# Patient Record
Sex: Female | Born: 1970
Health system: Southern US, Community
[De-identification: ages and names within clinical notes are randomized; demographics above are authoritative.]

## PROBLEM LIST (undated history)

## (undated) DIAGNOSIS — R112 Nausea with vomiting, unspecified: Secondary | ICD-10-CM

## (undated) DIAGNOSIS — Z9889 Other specified postprocedural states: Secondary | ICD-10-CM

## (undated) DIAGNOSIS — N83209 Unspecified ovarian cyst, unspecified side: Secondary | ICD-10-CM

## (undated) DIAGNOSIS — N390 Urinary tract infection, site not specified: Secondary | ICD-10-CM

## (undated) DIAGNOSIS — E079 Disorder of thyroid, unspecified: Secondary | ICD-10-CM

## (undated) DIAGNOSIS — Z9851 Tubal ligation status: Secondary | ICD-10-CM

## (undated) DIAGNOSIS — R1031 Right lower quadrant pain: Secondary | ICD-10-CM

## (undated) HISTORY — PX: COLONOSCOPY: SHX174

## (undated) HISTORY — DX: Urinary tract infection, site not specified: N39.0

## (undated) HISTORY — DX: Disorder of thyroid, unspecified: E07.9

## (undated) HISTORY — PX: TUBAL LIGATION: SHX77

---

## 1997-12-27 ENCOUNTER — Other Ambulatory Visit: Admission: RE | Admit: 1997-12-27 | Discharge: 1997-12-27 | Payer: Self-pay | Admitting: *Deleted

## 1998-02-04 HISTORY — PX: INCISION AND DRAINAGE BREAST ABSCESS: SUR672

## 1998-07-18 ENCOUNTER — Inpatient Hospital Stay (HOSPITAL_COMMUNITY): Admission: AD | Admit: 1998-07-18 | Discharge: 1998-07-21 | Payer: Self-pay | Admitting: Obstetrics and Gynecology

## 1998-07-22 ENCOUNTER — Encounter (HOSPITAL_COMMUNITY): Admission: RE | Admit: 1998-07-22 | Discharge: 1998-10-20 | Payer: Self-pay | Admitting: Obstetrics and Gynecology

## 1998-08-11 ENCOUNTER — Other Ambulatory Visit: Admission: RE | Admit: 1998-08-11 | Discharge: 1998-08-11 | Payer: Self-pay | Admitting: *Deleted

## 1998-08-31 ENCOUNTER — Other Ambulatory Visit: Admission: RE | Admit: 1998-08-31 | Discharge: 1998-08-31 | Payer: Self-pay | Admitting: Surgery

## 1998-10-13 ENCOUNTER — Ambulatory Visit (HOSPITAL_COMMUNITY): Admission: RE | Admit: 1998-10-13 | Discharge: 1998-10-14 | Payer: Self-pay | Admitting: Surgery

## 1998-10-23 ENCOUNTER — Encounter (HOSPITAL_COMMUNITY): Admission: RE | Admit: 1998-10-23 | Discharge: 1999-01-21 | Payer: Self-pay | Admitting: Obstetrics and Gynecology

## 1999-01-22 ENCOUNTER — Encounter (HOSPITAL_COMMUNITY): Admission: RE | Admit: 1999-01-22 | Discharge: 1999-04-10 | Payer: Self-pay | Admitting: Obstetrics and Gynecology

## 1999-07-30 ENCOUNTER — Ambulatory Visit (HOSPITAL_COMMUNITY): Admission: RE | Admit: 1999-07-30 | Discharge: 1999-07-30 | Payer: Self-pay | Admitting: Pediatrics

## 1999-07-30 ENCOUNTER — Encounter: Payer: Self-pay | Admitting: Pediatrics

## 2009-09-26 ENCOUNTER — Emergency Department (HOSPITAL_COMMUNITY): Admission: EM | Admit: 2009-09-26 | Discharge: 2009-09-26 | Payer: Self-pay | Admitting: Emergency Medicine

## 2009-11-21 ENCOUNTER — Ambulatory Visit: Payer: Self-pay | Admitting: Internal Medicine

## 2009-11-21 DIAGNOSIS — Z9189 Other specified personal risk factors, not elsewhere classified: Secondary | ICD-10-CM | POA: Insufficient documentation

## 2009-11-21 DIAGNOSIS — J309 Allergic rhinitis, unspecified: Secondary | ICD-10-CM | POA: Insufficient documentation

## 2009-11-23 ENCOUNTER — Encounter: Admission: RE | Admit: 2009-11-23 | Discharge: 2009-11-23 | Payer: Self-pay | Admitting: Internal Medicine

## 2010-03-06 NOTE — Assessment & Plan Note (Signed)
Summary: to be est/side pain on and off for a while/njr   Vital Signs:  Patient profile:   40 year old female Height:      69.25 inches Weight:      195 pounds BMI:     28.69 Temp:     98.8 degrees F oral BP sitting:   120 / 80  (right arm) Cuff size:   regular  Vitals Entered By: Duard Brady LPN (November 21, 2009 1:14 PM) CC: new to establish - doing well - just moved back from [facility].    **declines flu vaccine Is Patient Diabetic? No   CC:  new to establish - doing well - just moved back from [facility].    **declines flu vaccine.  History of Present Illness: 40 year old patient who is seen today to establish with our practice.  She has a chief complaint of left lower quadrant tenderness been present since March of this year.  She had a gynecologic examination at that time that included an abdominal but not a pelvic ultrasound.  Her Pap was normal as was the abdominal ultrasound.  Due to bright red rectal bleeding.  She also underwent a colonoscopy earlier this year with normal findings.  Her periods are normal.  Preventive Screening-Counseling & Management  Alcohol-Tobacco     Smoking Status: never  Allergies (verified): No Known Drug Allergies  Past History:  Past Medical History: left lower quadrant pain Allergic rhinitis breast biopsy  Past Surgical History: gravida 3, para 3, abortus zero C-section x 3 tubal ligation 2005 with left salpingectomy breast biopsy 2000  Family History: Reviewed history and no changes required. father age 43, history of colonic polyps, bladder cancer, remote tobacco use, history of cerebral aneurysm, and secondary seizure disorder mother, age 83, history of type 2 diabetes, dyslipidemia, hypertension, coronary artery disease, status post stent  Two brothers, one history of chronic seizure disorder, status post surgery to correct  One sister with metabolic syndrome  Social History: Reviewed history and no changes required. 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  The patient denies anorexia, fever, weight loss, vision loss, decreased hearing, hoarseness, chest pain, syncope, dyspnea on exertion, peripheral edema, prolonged cough, headaches, hemoptysis, abdominal pain, melena, hematochezia, severe indigestion/heartburn, hematuria, incontinence, genital sores, muscle weakness, suspicious skin lesions, transient blindness, difficulty walking, depression, unusual weight change, abnormal bleeding, enlarged lymph nodes, angioedema, and breast masses.    Physical Exam  General:  overweight-appearing.  120/78 Head:  Normocephalic and atraumatic without obvious abnormalities. No apparent alopecia or balding. Eyes:  No corneal or conjunctival inflammation noted. EOMI. Perrla. Funduscopic exam benign, without hemorrhages, exudates or papilledema. Vision grossly normal. Ears:  External ear exam shows no significant lesions or deformities.  Otoscopic examination reveals clear canals, tympanic membranes are intact bilaterally without bulging, retraction, inflammation or discharge. Hearing is grossly normal bilaterally. Mouth:  Oral mucosa and oropharynx without lesions or exudates.  Teeth in good repair. Neck:  No deformities, masses, or tenderness noted. Chest Wall:  No deformities, masses, or tenderness noted. Lungs:  Normal respiratory effort, chest expands symmetrically. Lungs are clear to auscultation, no crackles or wheezes. Heart:  Normal rate and regular rhythm. S1 and S2 normal without gallop, murmur, click, rub or other extra sounds. Abdomen:  left lower quadrant tenderness.  Bowel sounds normal.  No guarding or rebound Msk:  No deformity or  scoliosis noted of thoracic or lumbar spine.   Pulses:  R and L carotid,radial,femoral,dorsalis pedis and posterior tibial pulses are full and equal bilaterally Extremities:  No  clubbing, cyanosis, edema, or deformity noted with normal full range of motion of all joints.   Skin:  Intact without suspicious lesions or rashes Cervical Nodes:  No lymphadenopathy noted Axillary Nodes:  No palpable lymphadenopathy Inguinal Nodes:  No significant adenopathy Psych:  Cognition and judgment appear intact. Alert and cooperative with normal attention span and concentration. No apparent delusions, illusions, hallucinations   Impression & Recommendations:  Problem # 1:  Preventive Health Care (ICD-V70.0)  Problem # 2:  ABDOMINAL PAIN, LEFT LOWER QUADRANT, HX OF (ICD-V15.89)  Orders: Radiology Referral (Radiology)  Complete Medication List: 1)  No Current Rx Meds   Patient Instructions: 1)  It is important that you exercise regularly at least 20 minutes 5 times a week. If you develop chest pain, have severe difficulty breathing, or feel very tired , stop exercising immediately and seek medical attention. 2)  You need to lose weight. Consider a lower calorie diet and regular exercise.  3)  pelvic ultrasound as scheduled   Orders Added: 1)  New Patient 18-39 years [99385] 2)  Radiology Referral [Radiology]

## 2012-03-20 ENCOUNTER — Emergency Department (HOSPITAL_COMMUNITY): Payer: 59

## 2012-03-20 ENCOUNTER — Emergency Department (HOSPITAL_COMMUNITY)
Admission: EM | Admit: 2012-03-20 | Discharge: 2012-03-20 | Disposition: A | Discharge status: Home / Self Care | Payer: 59 | Attending: Emergency Medicine | Admitting: Emergency Medicine

## 2012-03-20 ENCOUNTER — Encounter (HOSPITAL_COMMUNITY): Payer: Self-pay | Admitting: Emergency Medicine

## 2012-03-20 DIAGNOSIS — W2209XA Striking against other stationary object, initial encounter: Secondary | ICD-10-CM | POA: Insufficient documentation

## 2012-03-20 DIAGNOSIS — Y9289 Other specified places as the place of occurrence of the external cause: Secondary | ICD-10-CM | POA: Insufficient documentation

## 2012-03-20 DIAGNOSIS — S92919A Unspecified fracture of unspecified toe(s), initial encounter for closed fracture: Secondary | ICD-10-CM | POA: Insufficient documentation

## 2012-03-20 DIAGNOSIS — S92911A Unspecified fracture of right toe(s), initial encounter for closed fracture: Secondary | ICD-10-CM

## 2012-03-20 DIAGNOSIS — Y9301 Activity, walking, marching and hiking: Secondary | ICD-10-CM | POA: Insufficient documentation

## 2012-03-20 DIAGNOSIS — F17200 Nicotine dependence, unspecified, uncomplicated: Secondary | ICD-10-CM | POA: Insufficient documentation

## 2012-03-20 MED ORDER — OXYCODONE-ACETAMINOPHEN 5-325 MG PO TABS: ORAL_TABLET | ORAL | Status: DC

## 2012-03-20 MED ORDER — OXYCODONE-ACETAMINOPHEN 5-325 MG PO TABS
2.0000 | ORAL_TABLET | Freq: Once | ORAL | Status: AC
  Administered 2012-03-20: 2 via ORAL
  Filled 2012-03-20: qty 2

## 2012-03-20 NOTE — ED Provider Notes (Signed)
History    This chart was scribed for Glade Nurse, PA, non-physician practitioner working with Dione Booze, MD by Charolett Bumpers, ED Scribe. This patient was seen in room WTR9/WTR9 and the patient's care was started at 2301.   CSN: [id number]  Arrival date & time 03/20/12  2123   First MD Initiated Contact with Patient 03/20/12 2301      Chief Complaint  Patient presents with  . Toe Injury    The history is provided by the patient. No language interpreter was used.   Sydney Hale is a 42 y.o. female who presents to the Emergency Department complaining of constant, severe right middle toe pain after injuring her foot earlier today. She states that she was walking when she hit her right middle toe on a door. She states that she heard a popping noise. She reports she has swelling to the 3 middle toes with bruising on the dorsum of the right foot. She denies taking anything for her pain prior to arrival and has not applied ice. She states that she is able to ambulate but with pain. She states that bearing weight aggravates her pain.   History reviewed. No pertinent past medical history.  Past Surgical History  Procedure Laterality Date  . Cesarean section      x2  . Tubal ligation      No family history on file.  History  Substance Use Topics  . Smoking status: Never Smoker   . Smokeless tobacco: Not on file  . Alcohol Use: No    OB History   Grav Para Term Preterm Abortions TAB SAB Ect Mult Living                  Review of Systems  Constitutional: Negative for fever and diaphoresis.  HENT: Negative for neck pain and neck stiffness.   Eyes: Negative for visual disturbance.  Respiratory: Negative for apnea, chest tightness and shortness of breath.   Cardiovascular: Negative for chest pain and palpitations.  Gastrointestinal: Negative for nausea, vomiting, diarrhea and constipation.  Genitourinary: Negative for dysuria.  Musculoskeletal: Positive for  arthralgias and gait problem.       Right 3rd toe pain with bruising and swelling.  Skin: Negative for rash.  Neurological: Negative for dizziness, weakness, light-headedness, numbness and headaches.  All other systems reviewed and are negative.    Allergies  Review of patient's allergies indicates no known allergies.  Home Medications  No current outpatient prescriptions on file.  BP 125/64  Pulse 77  Temp(Src) 98.3 F (36.8 C) (Oral)  Resp 20  SpO2 100%  LMP 03/04/2012  Physical Exam  Nursing note and vitals reviewed. Constitutional: She is oriented to person, place, and time. She appears well-developed and well-nourished. No distress.  HENT:  Head: Normocephalic and atraumatic.  Eyes: EOM are normal. Pupils are equal, round, and reactive to light.  Neck: Normal range of motion. Neck supple.  No meningeal signs  Cardiovascular: Normal rate, regular rhythm and normal heart sounds.  Exam reveals no gallop and no friction rub.   No murmur heard. Pulmonary/Chest: Effort normal and breath sounds normal. No respiratory distress. She has no wheezes. She has no rales. She exhibits no tenderness.  Abdominal: Soft. Bowel sounds are normal. She exhibits no distension. There is no tenderness. There is no rebound and no guarding.  Musculoskeletal: Normal range of motion. She exhibits edema and tenderness.  Pedal pulses intact bilaterally. Severe tenderness to palpation over the right 3rd toe.  Bruising and swelling to dorsum of foot. Skin intact. FROM of ankle and non-injured metatarsals. Wt bearing capable, but painful  Neurological: She is alert and oriented to person, place, and time. No cranial nerve deficit.  No focal deficits. Sensation to light touch intact.  Skin: Skin is warm and dry. She is not diaphoretic. No erythema.    ED Course  Procedures (including critical care time)  DIAGNOSTIC STUDIES: Oxygen Saturation is 100% on room air, normal by my interpretation.     COORDINATION OF CARE:  23:07-Discussed planned course of treatment with the patient including pain medication and f/u with PCP, who is agreeable at this time. Waiting on x-ray results.   23:22-Pt's toes have been buddy taped, placed in a post op boot and will be sent home with crutches and prescription for percocet.   Labs Reviewed - No data to display Dg Foot Complete Right  03/20/2012  *RADIOLOGY REPORT*  Clinical Data: Third right toe pain  RIGHT FOOT COMPLETE - 3+ VIEW  Comparison: None.  Findings: There is an acute fracture involving the third proximal phalanx.  The fracture fragments are in near anatomic alignment. No dislocations.  IMPRESSION:  1.  Third proximal phalanx fracture.   Original Report Authenticated By: Signa Kell, M.D.      Diagnosis: Right toe fracture, 3rd digit    MDM  Injured extremity is neurovascularly intact.  Pain managed in ED. Pt advised to follow up with orthopedics if symptoms persist. Ortho consult appreciated supplying pt with buddy tape, hard shoe, and crutches.  At this time there does not appear to be any evidence of an acute emergency medical condition and the patient appears stable for discharge.Diagnosis was discussed with patient who verbalizes understanding and is agreeable to discharge.   Glade Nurse, PA-C 03/21/12 1118

## 2012-03-20 NOTE — ED Notes (Signed)
Pt c/o R middle toe pain after hitting door, bruising and slight deformity noted.

## 2012-03-21 NOTE — ED Provider Notes (Signed)
Medical screening examination/treatment/procedure(s) were performed by non-physician practitioner and as supervising physician I was immediately available for consultation/collaboration.   Dione Booze, MD 03/21/12 [phone]

## 2012-06-26 ENCOUNTER — Encounter: Payer: Self-pay | Admitting: Internal Medicine

## 2012-06-26 ENCOUNTER — Ambulatory Visit (INDEPENDENT_AMBULATORY_CARE_PROVIDER_SITE_OTHER): Payer: PRIVATE HEALTH INSURANCE | Admitting: Internal Medicine

## 2012-06-26 VITALS — BP 130/90 | HR 70 | Temp 98.0°F | Resp 18 | Wt 205.0 lb

## 2012-06-26 DIAGNOSIS — Z9189 Other specified personal risk factors, not elsewhere classified: Secondary | ICD-10-CM

## 2012-06-26 DIAGNOSIS — Z0000 Encounter for general adult medical examination without abnormal findings: Secondary | ICD-10-CM

## 2012-06-26 DIAGNOSIS — R5381 Other malaise: Secondary | ICD-10-CM

## 2012-06-26 DIAGNOSIS — K625 Hemorrhage of anus and rectum: Secondary | ICD-10-CM

## 2012-06-26 DIAGNOSIS — R5383 Other fatigue: Secondary | ICD-10-CM

## 2012-06-26 LAB — LIPID PANEL
Cholesterol: 213 mg/dL — ABNORMAL HIGH (ref 0–200)
HDL: 37.6 mg/dL — ABNORMAL LOW (ref >39.00)
Total CHOL/HDL Ratio: 6
Triglycerides: 205 mg/dL — ABNORMAL HIGH (ref 0.0–149.0)
VLDL: 41 mg/dL — ABNORMAL HIGH (ref 0.0–40.0)

## 2012-06-26 LAB — COMPREHENSIVE METABOLIC PANEL
Albumin: 3.9 g/dL (ref 3.5–5.2)
Alkaline Phosphatase: 38 U/L — ABNORMAL LOW (ref 39–117)
BUN: 10 mg/dL (ref 6–23)
Glucose, Bld: 107 mg/dL — ABNORMAL HIGH (ref 70–99)
Potassium: 3.5 mEq/L (ref 3.5–5.1)
Total Bilirubin: 0.6 mg/dL (ref 0.3–1.2)

## 2012-06-26 LAB — CBC WITH DIFFERENTIAL/PLATELET
Eosinophils Relative: 2.8 % (ref 0.0–5.0)
HCT: 38.4 % (ref 36.0–46.0)
Lymphocytes Relative: 28.1 % (ref 12.0–46.0)
Lymphs Abs: 2.3 10*3/uL (ref 0.7–4.0)
Monocytes Relative: 4.8 % (ref 3.0–12.0)
Platelets: 238 10*3/uL (ref 150.0–400.0)
WBC: 8.1 10*3/uL (ref 4.5–10.5)

## 2012-06-26 LAB — SEDIMENTATION RATE: Sed Rate: 14 mm/hr (ref 0–22)

## 2012-06-26 MED ORDER — HYDROCORTISONE ACETATE 25 MG RE SUPP: 25.0000 mg | Freq: Two times a day (BID) | RECTAL | Status: DC

## 2012-06-26 NOTE — Progress Notes (Signed)
Subjective:    Patient ID: Sydney Hale, female    DOB: 1970-04-18, 42 y.o.   MRN: [id number]  HPI  42 year old patient who is seen today for followup. She has not been seen since October 2001 she is status with this practice. At that time she was experiencing a left lower quadrant pain as well as rectal bleeding. She did have a colonoscopy performed in [place] in approximately 2010.  Complaints today include fatigue of a few months duration she has occasional nausea and some modest weight gain for the past few weeks she has had some mild headaches. She has had a daughter who died of cerebral palsy complications about a year and a half ago. Patient does not feel depression has been plan a role. He continues to have frequent bright red rectal bleeding. Evaluation in the past has also included an abdominal ultrasound. No recent lab.  Menstrual periods have been normal  Alcohol-Tobacco  Smoking Status: never   Allergies (verified):  No Known Drug Allergies   Past History:  Past Medical History:  left lower quadrant pain  Allergic rhinitis  breast biopsy   Past Surgical History:  gravida 3, para 3, abortus zero  C-section x 3  tubal ligation 2005 with left salpingectomy  breast biopsy 2000 Colonoscopy 2010  Family History:  Reviewed history and no changes required.  father age 3, history of colonic polyps, bladder cancer, remote tobacco use, history of cerebral aneurysm, and secondary seizure disorder  mother, age 54, history of type 2 diabetes, dyslipidemia, hypertension, coronary artery disease, status post stent  Two brothers, one history of chronic seizure disorder, status post surgery to correct  One sister with metabolic syndrome  Paternal grandmother with colon cancer  Social History:  Reviewed history and no changes required.  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  HENT: Negative for hearing loss, congestion, sore throat, rhinorrhea, dental problem, sinus pressure and tinnitus.   Eyes: Negative for pain, discharge and visual disturbance.  Respiratory: Negative for cough and shortness of breath.   Cardiovascular: Negative for chest pain, palpitations and leg swelling.  Gastrointestinal: Positive for nausea, blood in stool and anal bleeding. Negative for vomiting, abdominal pain, diarrhea, constipation and abdominal distention.  Genitourinary: Negative for dysuria, urgency, frequency, hematuria, flank pain, vaginal bleeding, vaginal discharge, difficulty urinating, vaginal pain and pelvic pain.  Musculoskeletal: Negative for joint swelling, arthralgias and gait problem.  Skin: Negative for rash.  Neurological: Positive for headaches. Negative for dizziness, syncope, speech difficulty, weakness and numbness.  Hematological: Negative for adenopathy.  Psychiatric/Behavioral: Negative for behavioral problems, dysphoric mood and agitation. The patient is not nervous/anxious.        Objective:   Physical Exam  Constitutional: She is oriented to person, place, and time. She appears well-developed and well-nourished.  Overweight No distress Blood pressure 130/80  HENT:  Head: Normocephalic.  Right Ear: External ear normal.  Left Ear: External ear normal.  Mouth/Throat: Oropharynx is clear and moist.  Eyes: Conjunctivae and EOM are normal. Pupils are equal, round, and reactive to light.  Neck: Normal range of motion. Neck supple. No thyromegaly present.  No thyroid enlargement  Cardiovascular: Normal rate, regular rhythm, normal heart sounds and intact distal pulses.   Pulmonary/Chest: Effort normal and breath sounds normal.  Abdominal: Soft. Bowel sounds are normal. She exhibits no mass. There is no tenderness.  Mild  left lower quadrant tenderness  Musculoskeletal: Normal range of motion.  Lymphadenopathy:     She has no cervical adenopathy.  Neurological: She is alert and oriented to person, place, and time. She has normal reflexes.  Reflexes brisk  Skin: Skin is warm and dry. No rash noted.  Psychiatric: She has a normal mood and affect. Her behavior is normal. Judgment and thought content normal.          Assessment & Plan:   Chronic fatigue History of allergic rhinitis History chronic right red rectal bleeding  Laboratory update will be reviewed Will treat with Anusol-HC suppositories and referred to GI if bleeding persists

## 2012-06-26 NOTE — Patient Instructions (Signed)
It is important that you exercise regularly, at least 20 minutes 3 to 4 times per week.  If you develop chest pain or shortness of breath seek  medical attention.  Return in one month for follow-up  

## 2012-07-01 ENCOUNTER — Telehealth: Payer: Self-pay | Admitting: Internal Medicine

## 2012-07-01 NOTE — Telephone Encounter (Signed)
Patient called stating that she would like a call back with lab results and also would like a copy of her labs. Please assist.

## 2012-07-02 NOTE — Telephone Encounter (Signed)
Spoke to pt told her labs are normal except cholesterol is slightly elevated at 213 and blood sugar also slightly elevated. Need to improve diet, exercise and weight loss per Dr. Amador Cunas. Pt verbalized understanding.

## 2012-07-02 NOTE — Telephone Encounter (Signed)
Please call/notify patient that lab/test/procedure is normal  except cholesterol been minimally elevated at 213. Blood sugar may also be slightly elevated. Suggest improved  diet exercise and weight loss

## 2012-07-27 ENCOUNTER — Encounter: Payer: Self-pay | Admitting: Internal Medicine

## 2012-07-27 ENCOUNTER — Ambulatory Visit (INDEPENDENT_AMBULATORY_CARE_PROVIDER_SITE_OTHER): Payer: PRIVATE HEALTH INSURANCE | Admitting: Internal Medicine

## 2012-07-27 VITALS — BP 110/70 | HR 62 | Temp 98.4°F | Resp 18 | Wt 201.0 lb

## 2012-07-27 DIAGNOSIS — E663 Overweight: Secondary | ICD-10-CM

## 2012-07-27 DIAGNOSIS — R072 Precordial pain: Secondary | ICD-10-CM

## 2012-07-27 DIAGNOSIS — J309 Allergic rhinitis, unspecified: Secondary | ICD-10-CM

## 2012-07-27 NOTE — Progress Notes (Signed)
Subjective:    Patient ID: Sydney Hale, female    DOB: 1970-10-03, 42 y.o.   MRN: [id number]  HPI  42 year old patient who is seen today in followup. She was seen about one month ago complaining of increasing fatigue some intermittent rectal bleeding and some mild headaches. She has started exercise regimen and feels much improved. Laboratory screen was fairly unremarkable except for some mild dyslipidemia. She seems motivated to the lose some additional weight and exercise more regularly. In general doing better today. She continues to have some mild headaches  The patient does describe a single episode of substernal chest pain that lasted about 45 minutes. This occurred a few weeks ago and has not recurred. It was not associated with exercise  He also describes over the past month a few episodes of cyanosis and numbness involving her left hand only. This occurs while walking. She did take pictures with her cell phone but no striking cyanosis appreciated on these images  Wt Readings from Last 3 Encounters:  07/27/12 201 lb (91.173 kg)  06/26/12 205 lb (92.987 kg)  11/21/09 195 lb (88.451 kg)   History reviewed. No pertinent past medical history.  History   Social History  . Marital Status: Married    Spouse Name: N/A    Number of Children: N/A  . Years of Education: N/A   Occupational History  . Not on file.   Social History Main Topics  . Smoking status: Never Smoker   . Smokeless tobacco: Not on file  . Alcohol Use: No  . Drug Use: No  . Sexually Active: Not on file   Other Topics Concern  . Not on file   Social History Narrative  . No narrative on file    Past Surgical History  Procedure Laterality Date  . Cesarean section      x2  . Tubal ligation      No family history on file.  No Known Allergies  Current Outpatient Prescriptions on File Prior to Visit  Medication Sig Dispense Refill  . acetaminophen (TYLENOL) 500 MG tablet Take 500 mg by mouth every  6 (six) hours as needed for pain.      . hydrocortisone (ANUSOL-HC) 25 MG suppository Place 1 suppository (25 mg total) rectally 2 (two) times daily.  12 suppository  4  . ibuprofen (ADVIL,MOTRIN) 200 MG tablet Take 400-600 mg by mouth every 6 (six) hours as needed for pain.       No current facility-administered medications on file prior to visit.    BP 110/70  Pulse 62  Temp(Src) 98.4 F (36.9 C) (Oral)  Resp 18  Wt 201 lb (91.173 kg)  BMI 29.47 kg/m2  SpO2 98%  LMP 07/14/2012    Review of Systems  Constitutional: Positive for fatigue.  HENT: Negative for hearing loss, congestion, sore throat, rhinorrhea, dental problem, sinus pressure and tinnitus.   Eyes: Negative for pain, discharge and visual disturbance.  Respiratory: Negative for cough and shortness of breath.   Cardiovascular: Negative for chest pain, palpitations and leg swelling.  Gastrointestinal: Negative for nausea, vomiting, abdominal pain, diarrhea, constipation, blood in stool and abdominal distention.  Genitourinary: Negative for dysuria, urgency, frequency, hematuria, flank pain, vaginal bleeding, vaginal discharge, difficulty urinating, vaginal pain and pelvic pain.  Musculoskeletal: Negative for joint swelling, arthralgias and gait problem.  Skin: Negative for rash.  Neurological: Positive for headaches. Negative for dizziness, syncope, speech difficulty, weakness and numbness.  Hematological: Negative for adenopathy.  Psychiatric/Behavioral: Negative for  behavioral problems, dysphoric mood and agitation. The patient is not nervous/anxious.        Objective:   Physical Exam  Constitutional: She is oriented to person, place, and time. She appears well-developed and well-nourished.  HENT:  Head: Normocephalic.  Right Ear: External ear normal.  Left Ear: External ear normal.  Mouth/Throat: Oropharynx is clear and moist.  Eyes: Conjunctivae and EOM are normal. Pupils are equal, round, and reactive to light.   Neck: Normal range of motion. Neck supple. No thyromegaly present.  Cardiovascular: Normal rate, regular rhythm, normal heart sounds and intact distal pulses.    Brachial blood pressures equal bilaterally Radial pulses equal bilaterally ulnar pulses faint but equal  Pulmonary/Chest: Effort normal and breath sounds normal.  Abdominal: Soft. Bowel sounds are normal. She exhibits no mass. There is no tenderness.  Musculoskeletal: Normal range of motion.  Lymphadenopathy:    She has no cervical adenopathy.  Neurological: She is alert and oriented to person, place, and time.  Skin: Skin is warm and dry. No rash noted.  Psychiatric: She has a normal mood and affect. Her behavior is normal.          Assessment & Plan:   Obesity. Dietary information dispensed. Will continue regular exercise regimen. Return here as needed Single episode of chest pain History of left hand cyanosis.  Options discusse;  will observe at this time. She report any worsening clinical symptoms

## 2012-07-27 NOTE — Patient Instructions (Signed)
It is important that you exercise regularly, at least 20 minutes 3 to 4 times per week.  If you develop chest pain or shortness of breath seek  medical attention.  You need to lose weight.  Consider a lower calorie diet and regular exercise.DASH Diet The DASH diet stands for "Dietary Approaches to Stop Hypertension." It is a healthy eating plan that has been shown to reduce high blood pressure (hypertension) in as little as 14 days, while also possibly providing other significant health benefits. These other health benefits include reducing the risk of breast cancer after menopause and reducing the risk of type 2 diabetes, heart disease, colon cancer, and stroke. Health benefits also include weight loss and slowing kidney failure in patients with chronic kidney disease.  DIET GUIDELINES  Limit salt (sodium). Your diet should contain less than 1500 mg of sodium daily.  Limit refined or processed carbohydrates. Your diet should include mostly whole grains. Desserts and added sugars should be used sparingly.  Include small amounts of heart-healthy fats. These types of fats include nuts, oils, and tub margarine. Limit saturated and trans fats. These fats have been shown to be harmful in the body. CHOOSING FOODS  The following food groups are based on a 2000 calorie diet. See your Registered Dietitian for individual calorie needs. Grains and Grain Products (6 to 8 servings daily)  Eat More Often: Whole-wheat bread, brown rice, whole-grain or wheat pasta, quinoa, popcorn without added fat or salt (air popped).  Eat Less Often: White bread, white pasta, white rice, cornbread. Vegetables (4 to 5 servings daily)  Eat More Often: Fresh, frozen, and canned vegetables. Vegetables may be raw, steamed, roasted, or grilled with a minimal amount of fat.  Eat Less Often/Avoid: Creamed or fried vegetables. Vegetables in a cheese sauce. Fruit (4 to 5 servings daily)  Eat More Often: All fresh, canned (in  natural juice), or frozen fruits. Dried fruits without added sugar. One hundred percent fruit juice ( cup [237 mL] daily).  Eat Less Often: Dried fruits with added sugar. Canned fruit in light or heavy syrup. Lean Meats, Fish, and Poultry (2 servings or less daily. One serving is 3 to 4 oz [85-114 g]).  Eat More Often: Ninety percent or leaner ground beef, tenderloin, sirloin. Round cuts of beef, chicken breast, turkey breast. All fish. Grill, bake, or broil your meat. Nothing should be fried.  Eat Less Often/Avoid: Fatty cuts of meat, turkey, or chicken leg, thigh, or wing. Fried cuts of meat or fish. Dairy (2 to 3 servings)  Eat More Often: Low-fat or fat-free milk, low-fat plain or light yogurt, reduced-fat or part-skim cheese.  Eat Less Often/Avoid: Milk (whole, 2%).Whole milk yogurt. Full-fat cheeses. Nuts, Seeds, and Legumes (4 to 5 servings per week)  Eat More Often: All without added salt.  Eat Less Often/Avoid: Salted nuts and seeds, canned beans with added salt. Fats and Sweets (limited)  Eat More Often: Vegetable oils, tub margarines without trans fats, sugar-free gelatin. Mayonnaise and salad dressings.  Eat Less Often/Avoid: Coconut oils, palm oils, butter, stick margarine, cream, half and half, cookies, candy, pie. FOR MORE INFORMATION The Dash Diet Eating Plan: www.dashdiet.org Document Released: 01/10/2011 Document Revised: 04/15/2011 Document Reviewed: 01/10/2011 [employer] Patient Information 2014 [employer].  

## 2012-08-02 ENCOUNTER — Emergency Department (HOSPITAL_COMMUNITY): Payer: 59

## 2012-08-02 ENCOUNTER — Encounter (HOSPITAL_COMMUNITY): Payer: Self-pay | Admitting: Emergency Medicine

## 2012-08-02 ENCOUNTER — Emergency Department (HOSPITAL_COMMUNITY)
Admission: EM | Admit: 2012-08-02 | Discharge: 2012-08-02 | Disposition: A | Discharge status: Home / Self Care | Payer: 59 | Attending: Emergency Medicine | Admitting: Emergency Medicine

## 2012-08-02 DIAGNOSIS — Y9389 Activity, other specified: Secondary | ICD-10-CM | POA: Insufficient documentation

## 2012-08-02 DIAGNOSIS — Y9289 Other specified places as the place of occurrence of the external cause: Secondary | ICD-10-CM | POA: Insufficient documentation

## 2012-08-02 DIAGNOSIS — S93602A Unspecified sprain of left foot, initial encounter: Secondary | ICD-10-CM

## 2012-08-02 DIAGNOSIS — S93609A Unspecified sprain of unspecified foot, initial encounter: Secondary | ICD-10-CM | POA: Insufficient documentation

## 2012-08-02 DIAGNOSIS — X500XXA Overexertion from strenuous movement or load, initial encounter: Secondary | ICD-10-CM | POA: Insufficient documentation

## 2012-08-02 MED ORDER — HYDROCODONE-ACETAMINOPHEN 5-325 MG PO TABS: 2.0000 | ORAL_TABLET | ORAL | Status: DC | PRN

## 2012-08-02 MED ORDER — HYDROCODONE-ACETAMINOPHEN 5-325 MG PO TABS
2.0000 | ORAL_TABLET | Freq: Once | ORAL | Status: AC
  Administered 2012-08-02: 1 via ORAL
  Filled 2012-08-02: qty 1

## 2012-08-02 MED ORDER — PROMETHAZINE HCL 25 MG PO TABS: 25.0000 mg | ORAL_TABLET | Freq: Four times a day (QID) | ORAL | Status: DC | PRN

## 2012-08-02 MED ORDER — DIPHENHYDRAMINE HCL 25 MG PO CAPS
25.0000 mg | ORAL_CAPSULE | Freq: Once | ORAL | Status: AC
  Administered 2012-08-02: 25 mg via ORAL
  Filled 2012-08-02: qty 1

## 2012-08-02 NOTE — ED Notes (Signed)
Pt reports that she was at [employer]. She got her foot caught under the edge of a trampoline and heard a "pop". Pt has swelling to top of L top of foot. Pt has good pulses, color and is able to move toes. Pt is A&O and in NAD

## 2012-08-02 NOTE — ED Provider Notes (Signed)
History    This chart was scribed for non-physician practitioner, Mora Bellman, PA-C, working with Hilario Quarry, MD by Melba Coon, ED Scribe. This patient was seen in room WTR9/WTR9 and the patient's care was started at 3:08PM.    CSN: [id number] Arrival date & time 08/02/12  1431  First MD Initiated Contact with Patient 08/02/12 1504     Chief Complaint  Patient presents with  . Foot Pain   (Consider location/radiation/quality/duration/timing/severity/associated sxs/prior Treatment) The history is provided by the patient. No language interpreter was used.   HPI Comments: Sydney Hale is a 42 y.o. female who presents to the Emergency Department complaining of constant, moderate to severe left foot pain and swelling with a sudden onset yesterday afternoon. Katrena reports that she was at [employer]. She got her foot caught under the edge of a trampoline and heard a "pop". Ice has not fully alleviated the pain. Motrin has made her more comfortable and made her able to sleep but has not fully alleviated her pain. She reports the pain and swelling has gotten significantly worse since last night. Today she reports her foot has a tingling sensation "like when your foot is asleep". She cannot bear weight; she comes into the ED with crutches from a previous ankle injury 2 months ago on the opposite leg. She denies previous injuries to the left foot. Allergic to percocet. No other pertinent medical symptoms.  History reviewed. No pertinent past medical history. Past Surgical History  Procedure Laterality Date  . Cesarean section      x2  . Tubal ligation     No family history on file. History  Substance Use Topics  . Smoking status: Never Smoker   . Smokeless tobacco: Not on file  . Alcohol Use: No   OB History   Grav Para Term Preterm Abortions TAB SAB Ect Mult Living                 Review of Systems  Constitutional: Negative for appetite change and  fatigue.  HENT: Negative for congestion, sinus pressure and ear discharge.   Eyes: Negative for discharge.  Respiratory: Negative for cough.   Cardiovascular: Negative for chest pain.  Gastrointestinal: Negative for abdominal pain and diarrhea.  Genitourinary: Negative for frequency and hematuria.  Musculoskeletal: Positive for joint swelling and arthralgias. Negative for back pain.  Skin: Negative for rash.  Neurological: Negative for seizures and headaches.  Psychiatric/Behavioral: Negative for hallucinations.  All other systems reviewed and are negative.    Allergies  Allergic to percocet - she reports it give her flushing with heat intolerance  Home Medications   Current Outpatient Rx  Name  Route  Sig  Dispense  Refill  . b complex vitamins tablet   Oral   Take 1 tablet by mouth every morning.         . cholecalciferol (VITAMIN D) 1000 UNITS tablet   Oral   Take 1,000 Units by mouth every morning.         Marland Kitchen ibuprofen (ADVIL,MOTRIN) 200 MG tablet   Oral   Take 800 mg by mouth every 6 (six) hours as needed for pain.           BP 135/71  Pulse 68  Temp(Src) 98.4 F (36.9 C) (Oral)  Resp 20  SpO2 99%  LMP 07/14/2012 Physical Exam  Nursing note and vitals reviewed. Constitutional: She is oriented to person, place, and time. She appears well-developed and well-nourished. No  distress.  HENT:  Head: Normocephalic and atraumatic.  Right Ear: External ear normal.  Left Ear: External ear normal.  Nose: Nose normal.  Mouth/Throat: Oropharynx is clear and moist.  Eyes: Conjunctivae are normal.  Neck: Normal range of motion.  Cardiovascular: Normal rate, regular rhythm and normal heart sounds.   Pulmonary/Chest: Effort normal and breath sounds normal. No stridor. No respiratory distress. She has no wheezes. She has no rales.  Abdominal: Soft. She exhibits no distension.  Musculoskeletal: Normal range of motion. She exhibits edema and tenderness.  TTP to dorsal  aspect of left foot with significant swelling. DP pulses intact. ROM limited to pain.  Neurological: She is alert and oriented to person, place, and time. She has normal strength.  Skin: Skin is warm and dry. She is not diaphoretic. No erythema.  Psychiatric: She has a normal mood and affect. Her behavior is normal.    ED Course  Procedures (including critical care time)   COORDINATION OF CARE:  3:13PM - left foot XR and left ankle XR will be ordered for Sydney Hale.   3:45PM - imaging results reviewed and are unremarkable.  4:00PM - pt advised to apply ice and elevation of the foot. She is advised to rest off of her knee as much as possible. She is ordered and prescribed hydrocodone-acetaminophen and promethazine. She is given an ACE wrap. She is ready for d/c.  Labs Reviewed - No data to display Dg Ankle Complete Left  08/02/2012   *RADIOLOGY REPORT*  Clinical Data: Foot and ankle pain following twisting injury yesterday.  LEFT ANKLE COMPLETE - 3+ VIEW  Comparison: Foot radiographs 09/26/2009 are labeled left  Findings: The mineralization and alignment are normal.  There is no evidence of acute fracture or dislocation.  The joint spaces are maintained.  No focal soft tissue swelling is evident.  IMPRESSION: No acute osseous findings.   Original Report Authenticated By: Carey Bullocks, M.D.   Dg Foot Complete Left  08/02/2012   *RADIOLOGY REPORT*  Clinical Data: Foot and ankle pain following twisting injury yesterday.  LEFT FOOT - COMPLETE 3+ VIEW  Comparison: Foot radiographs 09/26/2009 are labeled left.  Findings: The mineralization and alignment are normal.  There is no evidence of acute fracture or dislocation.  The joint spaces are maintained.  No focal soft tissue swelling is identified.  IMPRESSION: No acute osseous findings.   Original Report Authenticated By: Carey Bullocks, M.D.   1. Foot sprain, left, initial encounter     MDM  Patient presents with foot injury after  trauma. No fracture on xray. Discussed RICE. She has crutches and post op shoe. She was given an ace bandage for compression. Pain meds given in ED. Rx for home. Follow up with PCP. Ortho referral if no improvement. Return instructions given. Vital signs stable for discharge. Patient / Family / Caregiver informed of clinical course, understand medical decision-making process, and agree with plan.   Medications  HYDROcodone-acetaminophen (NORCO/VICODIN) 5-325 MG per tablet 2 tablet (1 tablet Oral Given 08/02/12 1552)  diphenhydrAMINE (BENADRYL) capsule 25 mg (25 mg Oral Given 08/02/12 1553)       I personally performed the services described in this documentation, which was scribed in my presence. The recorded information has been reviewed and is accurate.    Mora Bellman, PA-C 08/02/12 1812

## 2012-08-03 NOTE — ED Provider Notes (Signed)
History/physical exam/procedure(s) were performed by non-physician practitioner and as supervising physician I was immediately available for consultation/collaboration. I have reviewed all notes and am in agreement with care and plan.   Makayli Bracken S Jaidan Stachnik, MD 08/03/12 1158 

## 2012-10-20 ENCOUNTER — Emergency Department (HOSPITAL_COMMUNITY)
Admission: EM | Admit: 2012-10-20 | Discharge: 2012-10-21 | Disposition: A | Discharge status: Home / Self Care | Payer: PRIVATE HEALTH INSURANCE | Attending: Emergency Medicine | Admitting: Emergency Medicine

## 2012-10-20 ENCOUNTER — Encounter (HOSPITAL_COMMUNITY): Payer: Self-pay | Admitting: Emergency Medicine

## 2012-10-20 ENCOUNTER — Emergency Department (HOSPITAL_COMMUNITY): Payer: PRIVATE HEALTH INSURANCE

## 2012-10-20 DIAGNOSIS — Z3202 Encounter for pregnancy test, result negative: Secondary | ICD-10-CM | POA: Insufficient documentation

## 2012-10-20 DIAGNOSIS — R109 Unspecified abdominal pain: Secondary | ICD-10-CM | POA: Insufficient documentation

## 2012-10-20 DIAGNOSIS — R110 Nausea: Secondary | ICD-10-CM | POA: Insufficient documentation

## 2012-10-20 DIAGNOSIS — Z9851 Tubal ligation status: Secondary | ICD-10-CM | POA: Insufficient documentation

## 2012-10-20 LAB — CBC WITH DIFFERENTIAL/PLATELET
Basophils Absolute: 0.1 10*3/uL (ref 0.0–0.1)
Basophils Relative: 1 % (ref 0–1)
Eosinophils Absolute: 0.3 10*3/uL (ref 0.0–0.7)
Eosinophils Relative: 2 % (ref 0–5)
MCH: 25.7 pg — ABNORMAL LOW (ref 26.0–34.0)
MCHC: 33 g/dL (ref 30.0–36.0)
Neutrophils Relative %: 70 % (ref 43–77)
Platelets: 278 10*3/uL (ref 150–400)
RBC: 5.13 MIL/uL — ABNORMAL HIGH (ref 3.87–5.11)
RDW: 13.1 % (ref 11.5–15.5)

## 2012-10-20 LAB — URINE MICROSCOPIC-ADD ON

## 2012-10-20 LAB — URINALYSIS, ROUTINE W REFLEX MICROSCOPIC
Bilirubin Urine: NEGATIVE
Nitrite: NEGATIVE
Specific Gravity, Urine: 1.011 (ref 1.005–1.030)
pH: 6.5 (ref 5.0–8.0)

## 2012-10-20 LAB — COMPREHENSIVE METABOLIC PANEL
ALT: 16 U/L (ref 0–35)
Albumin: 4.1 g/dL (ref 3.5–5.2)
Alkaline Phosphatase: 47 U/L (ref 39–117)
Calcium: 10.1 mg/dL (ref 8.4–10.5)
Potassium: 3.9 mEq/L (ref 3.5–5.1)
Sodium: 136 mEq/L (ref 135–145)
Total Protein: 7.5 g/dL (ref 6.0–8.3)

## 2012-10-20 LAB — POCT PREGNANCY, URINE: Preg Test, Ur: NEGATIVE

## 2012-10-20 MED ORDER — MORPHINE SULFATE 4 MG/ML IJ SOLN
4.0000 mg | Freq: Once | INTRAMUSCULAR | Status: AC
Start: 2012-10-20 — End: 2012-10-20
  Administered 2012-10-20: 4 mg via INTRAVENOUS
  Filled 2012-10-20: qty 1

## 2012-10-20 MED ORDER — SODIUM CHLORIDE 0.9 % IV BOLUS (SEPSIS)
1000.0000 mL | INTRAVENOUS | Status: AC
  Administered 2012-10-20: 1000 mL via INTRAVENOUS

## 2012-10-20 MED ORDER — ONDANSETRON HCL 4 MG/2ML IJ SOLN
4.0000 mg | Freq: Once | INTRAMUSCULAR | Status: AC
  Administered 2012-10-20: 4 mg via INTRAVENOUS
  Filled 2012-10-20: qty 2

## 2012-10-20 MED ORDER — OXYCODONE-ACETAMINOPHEN 5-325 MG PO TABS
1.0000 | ORAL_TABLET | Freq: Once | ORAL | Status: AC
  Administered 2012-10-20: 1 via ORAL
  Filled 2012-10-20: qty 1

## 2012-10-20 NOTE — ED Provider Notes (Signed)
CSN: [id number]     Arrival date & time 10/20/12  1708 History   First MD Initiated Contact with Patient 10/20/12 2035     Chief Complaint  Patient presents with  . Flank Pain   (Consider location/radiation/quality/duration/timing/severity/associated sxs/prior Treatment) Patient is a 42 y.o. female presenting with flank pain. The history is provided by the patient.  Flank Pain This is a new problem. The current episode started 6 to 12 hours ago. The problem occurs constantly. The problem has not changed since onset.Pertinent negatives include no chest pain, no abdominal pain, no headaches and no shortness of breath. Nothing aggravates the symptoms. Nothing relieves the symptoms. Treatments tried: motrin. The treatment provided moderate relief.    History reviewed. No pertinent past medical history. Past Surgical History  Procedure Laterality Date  . Cesarean section      x2  . Tubal ligation     No family history on file. History  Substance Use Topics  . Smoking status: Never Smoker   . Smokeless tobacco: Not on file  . Alcohol Use: No   OB History   Grav Para Term Preterm Abortions TAB SAB Ect Mult Living                 Review of Systems  Constitutional: Negative for fever and fatigue.  HENT: Negative for congestion, drooling and neck pain.   Eyes: Negative for pain.  Respiratory: Negative for cough and shortness of breath.   Cardiovascular: Negative for chest pain.  Gastrointestinal: Positive for nausea. Negative for vomiting, abdominal pain and diarrhea.  Genitourinary: Positive for flank pain. Negative for dysuria and hematuria.  Musculoskeletal: Negative for back pain and gait problem.  Skin: Negative for color change.  Neurological: Negative for dizziness and headaches.  Hematological: Negative for adenopathy.  Psychiatric/Behavioral: Negative for behavioral problems.  All other systems reviewed and are negative.    Allergies  Review of patient's allergies  indicates no known allergies.  Home Medications   Current Outpatient Rx  Name  Route  Sig  Dispense  Refill  . ibuprofen (ADVIL,MOTRIN) 200 MG tablet   Oral   Take 600 mg by mouth every 6 (six) hours as needed for pain.           BP 132/89  Pulse 79  Temp(Src) 99 F (37.2 C) (Oral)  Resp 19  SpO2 100%  LMP 10/06/2012 Physical Exam  Nursing note and vitals reviewed. Constitutional: She is oriented to person, place, and time. She appears well-developed and well-nourished.  HENT:  Head: Normocephalic.  Mouth/Throat: Oropharynx is clear and moist. No oropharyngeal exudate.  Eyes: Conjunctivae and EOM are normal. Pupils are equal, round, and reactive to light.  Neck: Normal range of motion. Neck supple.  Cardiovascular: Normal rate, regular rhythm, normal heart sounds and intact distal pulses.  Exam reveals no gallop and no friction rub.   No murmur heard. Pulmonary/Chest: Effort normal and breath sounds normal. No respiratory distress. She has no wheezes.  Abdominal: Soft. Bowel sounds are normal. There is tenderness (mild right sided flank pain, no CVA ttp). There is no rebound and no guarding.  Musculoskeletal: Normal range of motion. She exhibits no edema and no tenderness.  Neurological: She is alert and oriented to person, place, and time.  Skin: Skin is warm and dry.  Psychiatric: She has a normal mood and affect. Her behavior is normal.    ED Course  Procedures (including critical care time) Labs Review Labs Reviewed  CBC WITH DIFFERENTIAL -  Abnormal; Notable for the following:    WBC 12.4 (*)    RBC 5.13 (*)    MCH 25.7 (*)    Neutro Abs 8.6 (*)    All other components within normal limits  COMPREHENSIVE METABOLIC PANEL - Abnormal; Notable for the following:    GFR calc non Af Amer 61 (*)    GFR calc Af Amer 71 (*)    All other components within normal limits  URINALYSIS, ROUTINE W REFLEX MICROSCOPIC - Abnormal; Notable for the following:    Hgb urine dipstick  TRACE (*)    All other components within normal limits  LIPASE, BLOOD  URINE MICROSCOPIC-ADD ON  POCT PREGNANCY, URINE   Imaging Review Ct Abdomen Pelvis Wo Contrast  10/20/2012   CLINICAL DATA:  Right-sided flank pain rule out stones.  EXAM: CT ABDOMEN AND PELVIS WITHOUT CONTRAST  TECHNIQUE: Multidetector CT imaging of the abdomen and pelvis was performed following the standard protocol without intravenous contrast.  COMPARISON:  None.  FINDINGS: LOWER CHEST:  Mediastinum: Unremarkable.  Lungs/pleura: No consolidation.  ABDOMEN/PELVIS:  Liver: No focal abnormality.  Biliary: No evidence of biliary obstruction or stone.  Pancreas: Unremarkable.  Spleen: Unremarkable.  Adrenals: Unremarkable.  Kidneys and ureters: No hydronephrosis or stone. Ureters are prominent but symmetric, possibly related to hydration status. No ureteral calculus seen.  Bladder: Unremarkable.  Bowel: No obstruction. Normal appendix.  Retroperitoneum: No mass or adenopathy.  Peritoneum: No free fluid or gas.  Reproductive: Dominant cyst or follicle in the right ovary, 2.3 cm in maximal diameter.  Vascular: No acute abnormality.  OSSEOUS: No acute abnormalities. No suspicious lytic or blastic lesions.  IMPRESSION: Negative for hydronephrosis or urolithiasis.   Electronically Signed   By: Tiburcio Pea   On: 10/20/2012 21:45   US Transvaginal Non-ob  10/21/2012   CLINICAL DATA:  Right-sided flank pain. Evaluate for ovarian torsion. Right ovarian cyst seen on CT examination.  EXAM: TRANSABDOMINAL AND TRANSVAGINAL ULTRASOUND OF PELVIS  DOPPLER ULTRASOUND OF OVARIES  TECHNIQUE: Both transabdominal and transvaginal ultrasound examinations of the pelvis were performed. Transabdominal technique was performed for global imaging of the pelvis including uterus, ovaries, adnexal regions, and pelvic cul-de-sac.  It was necessary to proceed with endovaginal exam following the transabdominal exam to visualize the ovaries. Color and duplex Doppler  ultrasound was utilized to evaluate blood flow to the ovaries.  COMPARISON:  CT of the abdomen and pelvis 10/20/2012.  FINDINGS: Uterus: Retroverted uterus measuring 7.7 x 5.0 x 5.4 cm. No focal lesions.  Endometrium: 8.9 mm thick. Small amount of fluid in the endometrial canal.  Right ovary: Normal in echotexture and appearance with multiple small follicles. A dominant follicle measuring 2.1 x 1.8 x 1.6 cm incidentally noted.  Left ovary:  Could not be visualized.  Pulsed Doppler evaluation of the right ovary demonstrates normal low-resistance arterial and venous waveforms.  No significant free fluid in the cul-de-sac. Multiple dilated vessels throughout the left adnexa.  IMPRESSION: 1. Limited examination which did not adequately visualize the left ovary. 2. Normal sonographic appearance of the right ovary. Specifically, no evidence of right ovarian torsion. 3. Dilated vessels throughout the left adnexal region are of uncertain etiology and significance, but can be seen in setting of pelvic congestion syndrome. Clinical correlation is recommended. 4. Additional incidental findings, as above.  No sonographic evidence for ovarian torsion.   Electronically Signed   By: Trudie Reed M.D.   On: 10/21/2012 00:32   US Pelvis Complete  10/21/2012  CLINICAL DATA:  Right-sided flank pain. Evaluate for ovarian torsion. Right ovarian cyst seen on CT examination.  EXAM: TRANSABDOMINAL AND TRANSVAGINAL ULTRASOUND OF PELVIS  DOPPLER ULTRASOUND OF OVARIES  TECHNIQUE: Both transabdominal and transvaginal ultrasound examinations of the pelvis were performed. Transabdominal technique was performed for global imaging of the pelvis including uterus, ovaries, adnexal regions, and pelvic cul-de-sac.  It was necessary to proceed with endovaginal exam following the transabdominal exam to visualize the ovaries. Color and duplex Doppler ultrasound was utilized to evaluate blood flow to the ovaries.  COMPARISON:  CT of the abdomen  and pelvis 10/20/2012.  FINDINGS: Uterus: Retroverted uterus measuring 7.7 x 5.0 x 5.4 cm. No focal lesions.  Endometrium: 8.9 mm thick. Small amount of fluid in the endometrial canal.  Right ovary: Normal in echotexture and appearance with multiple small follicles. A dominant follicle measuring 2.1 x 1.8 x 1.6 cm incidentally noted.  Left ovary:  Could not be visualized.  Pulsed Doppler evaluation of the right ovary demonstrates normal low-resistance arterial and venous waveforms.  No significant free fluid in the cul-de-sac. Multiple dilated vessels throughout the left adnexa.  IMPRESSION: 1. Limited examination which did not adequately visualize the left ovary. 2. Normal sonographic appearance of the right ovary. Specifically, no evidence of right ovarian torsion. 3. Dilated vessels throughout the left adnexal region are of uncertain etiology and significance, but can be seen in setting of pelvic congestion syndrome. Clinical correlation is recommended. 4. Additional incidental findings, as above.  No sonographic evidence for ovarian torsion.   Electronically Signed   By: Trudie Reed M.D.   On: 10/21/2012 00:32   [place] Art/ven Flow Abd Pelv Doppler  10/21/2012   CLINICAL DATA:  Right-sided flank pain. Evaluate for ovarian torsion. Right ovarian cyst seen on CT examination.  EXAM: TRANSABDOMINAL AND TRANSVAGINAL ULTRASOUND OF PELVIS  DOPPLER ULTRASOUND OF OVARIES  TECHNIQUE: Both transabdominal and transvaginal ultrasound examinations of the pelvis were performed. Transabdominal technique was performed for global imaging of the pelvis including uterus, ovaries, adnexal regions, and pelvic cul-de-sac.  It was necessary to proceed with endovaginal exam following the transabdominal exam to visualize the ovaries. Color and duplex Doppler ultrasound was utilized to evaluate blood flow to the ovaries.  COMPARISON:  CT of the abdomen and pelvis 10/20/2012.  FINDINGS: Uterus: Retroverted uterus measuring 7.7 x 5.0 x  5.4 cm. No focal lesions.  Endometrium: 8.9 mm thick. Small amount of fluid in the endometrial canal.  Right ovary: Normal in echotexture and appearance with multiple small follicles. A dominant follicle measuring 2.1 x 1.8 x 1.6 cm incidentally noted.  Left ovary:  Could not be visualized.  Pulsed Doppler evaluation of the right ovary demonstrates normal low-resistance arterial and venous waveforms.  No significant free fluid in the cul-de-sac. Multiple dilated vessels throughout the left adnexa.  IMPRESSION: 1. Limited examination which did not adequately visualize the left ovary. 2. Normal sonographic appearance of the right ovary. Specifically, no evidence of right ovarian torsion. 3. Dilated vessels throughout the left adnexal region are of uncertain etiology and significance, but can be seen in setting of pelvic congestion syndrome. Clinical correlation is recommended. 4. Additional incidental findings, as above.  No sonographic evidence for ovarian torsion.   Electronically Signed   By: Trudie Reed M.D.   On: 10/21/2012 00:32    MDM   1. Flank pain    9:14 PM 42 y.o. female who presents with right-sided flank pain that began around noon today. The patient denies any  fevers vomiting. She is afebrile and vital signs are unremarkable here. Suspect kidney stone. Will get labs and CT.  Cystic structure on right ovary noted on CT. Her pain is possibly related to an ovarian cyst rupture. Will get [place] to r/o torsion. Will plan for d/c home w/ pain medicine if neg. Transferred care to Dr. Gwendolyn Grant while awaiting [place].     Junius Argyle, MD 10/21/12 [id number]

## 2012-10-20 NOTE — ED Notes (Signed)
Pt c/o rt sided flank pain that comes and goes x 6 hours.

## 2012-10-21 MED ORDER — DIPHENHYDRAMINE HCL 25 MG PO CAPS
50.0000 mg | ORAL_CAPSULE | Freq: Once | ORAL | Status: AC
  Administered 2012-10-21: 50 mg via ORAL
  Filled 2012-10-21: qty 2

## 2012-10-21 MED ORDER — OXYCODONE-ACETAMINOPHEN 5-325 MG PO TABS: 1.0000 | ORAL_TABLET | Freq: Four times a day (QID) | ORAL | Status: DC | PRN

## 2012-10-21 NOTE — ED Provider Notes (Signed)
2300 - H&P please see Dr. Zebedee Iba note. 42 year old female here with right flank pain acute in onset. Intermittent. CT scan initially without any kidney stone, however possible right ovarian cyst. Awaiting transvaginal ultrasound upon my assumption of care. Transvaginal ultrasound unable to visualize left ovary. Right ovary shows no cysts. With pain on R side, do not feel I need to see L ovary on exam. Patient stable for discharge, given percocet and instructed to f/u with PCP.  Dagmar Hait, MD 10/21/12 [phone]

## 2012-10-22 ENCOUNTER — Telehealth: Payer: Self-pay | Admitting: Internal Medicine

## 2012-10-22 ENCOUNTER — Encounter (HOSPITAL_COMMUNITY): Payer: Self-pay | Admitting: Emergency Medicine

## 2012-10-22 ENCOUNTER — Emergency Department (HOSPITAL_COMMUNITY): Payer: 59

## 2012-10-22 ENCOUNTER — Observation Stay (HOSPITAL_COMMUNITY)
Admission: AD | Admit: 2012-10-22 | Discharge: 2012-10-23 | Disposition: A | Discharge status: Home / Self Care | Payer: 59 | Source: Ambulatory Visit | Attending: Obstetrics & Gynecology | Admitting: Obstetrics & Gynecology

## 2012-10-22 DIAGNOSIS — R109 Unspecified abdominal pain: Secondary | ICD-10-CM

## 2012-10-22 DIAGNOSIS — R112 Nausea with vomiting, unspecified: Secondary | ICD-10-CM | POA: Insufficient documentation

## 2012-10-22 DIAGNOSIS — R1031 Right lower quadrant pain: Secondary | ICD-10-CM | POA: Diagnosis present

## 2012-10-22 DIAGNOSIS — N854 Malposition of uterus: Secondary | ICD-10-CM | POA: Insufficient documentation

## 2012-10-22 DIAGNOSIS — N949 Unspecified condition associated with female genital organs and menstrual cycle: Principal | ICD-10-CM | POA: Insufficient documentation

## 2012-10-22 DIAGNOSIS — D72829 Elevated white blood cell count, unspecified: Secondary | ICD-10-CM | POA: Insufficient documentation

## 2012-10-22 DIAGNOSIS — N83209 Unspecified ovarian cyst, unspecified side: Secondary | ICD-10-CM | POA: Diagnosis present

## 2012-10-22 HISTORY — DX: Tubal ligation status: Z98.51

## 2012-10-22 HISTORY — DX: Right lower quadrant pain: R10.31

## 2012-10-22 HISTORY — DX: Nausea with vomiting, unspecified: R11.2

## 2012-10-22 HISTORY — DX: Unspecified ovarian cyst, unspecified side: N83.209

## 2012-10-22 HISTORY — DX: Other specified postprocedural states: Z98.890

## 2012-10-22 LAB — CBC WITH DIFFERENTIAL/PLATELET
Basophils Relative: 0 % (ref 0–1)
Eosinophils Absolute: 0.2 10*3/uL (ref 0.0–0.7)
Eosinophils Relative: 2 % (ref 0–5)
HCT: 36.8 % (ref 36.0–46.0)
Hemoglobin: 12.2 g/dL (ref 12.0–15.0)
MCH: 26 pg (ref 26.0–34.0)
MCHC: 33.2 g/dL (ref 30.0–36.0)
Monocytes Absolute: 0.7 10*3/uL (ref 0.1–1.0)
Monocytes Relative: 7 % (ref 3–12)

## 2012-10-22 LAB — COMPREHENSIVE METABOLIC PANEL
Albumin: 3.8 g/dL (ref 3.5–5.2)
BUN: 9 mg/dL (ref 6–23)
Calcium: 9.6 mg/dL (ref 8.4–10.5)
Creatinine, Ser: 0.9 mg/dL (ref 0.50–1.10)
Total Bilirubin: 0.5 mg/dL (ref 0.3–1.2)
Total Protein: 6.8 g/dL (ref 6.0–8.3)

## 2012-10-22 LAB — LIPASE, BLOOD: Lipase: 25 U/L (ref 11–59)

## 2012-10-22 MED ORDER — HYDROMORPHONE HCL PF 1 MG/ML IJ SOLN
1.0000 mg | INTRAMUSCULAR | Status: DC | PRN
  Administered 2012-10-22 – 2012-10-23 (×2): 1 mg via INTRAVENOUS
  Filled 2012-10-22 (×2): qty 1

## 2012-10-22 MED ORDER — HYDROMORPHONE HCL PF 1 MG/ML IJ SOLN
1.0000 mg | Freq: Once | INTRAMUSCULAR | Status: AC
  Administered 2012-10-22: 1 mg via INTRAVENOUS
  Filled 2012-10-22: qty 1

## 2012-10-22 MED ORDER — ONDANSETRON HCL 4 MG/2ML IJ SOLN
4.0000 mg | Freq: Once | INTRAMUSCULAR | Status: AC
  Administered 2012-10-22: 4 mg via INTRAVENOUS
  Filled 2012-10-22: qty 2

## 2012-10-22 MED ORDER — SODIUM CHLORIDE 0.9 % IV BOLUS (SEPSIS)
1000.0000 mL | Freq: Once | INTRAVENOUS | Status: AC
  Administered 2012-10-22: 1000 mL via INTRAVENOUS

## 2012-10-22 MED ORDER — DEXTROSE-NACL 5-0.9 % IV SOLN
INTRAVENOUS | Status: DC
  Administered 2012-10-22 – 2012-10-23 (×2): via INTRAVENOUS

## 2012-10-22 MED ORDER — ONDANSETRON 8 MG/NS 50 ML IVPB
8.0000 mg | Freq: Three times a day (TID) | INTRAVENOUS | Status: DC
  Administered 2012-10-23 (×2): 8 mg via INTRAVENOUS
  Filled 2012-10-22 (×3): qty 8

## 2012-10-22 MED ORDER — PROMETHAZINE HCL 25 MG/ML IJ SOLN
25.0000 mg | Freq: Three times a day (TID) | INTRAMUSCULAR | Status: DC | PRN
  Administered 2012-10-22 – 2012-10-23 (×2): 25 mg via INTRAVENOUS
  Filled 2012-10-22 (×2): qty 1

## 2012-10-22 MED ORDER — IOHEXOL 300 MG/ML  SOLN
100.0000 mL | Freq: Once | INTRAMUSCULAR | Status: AC | PRN
  Administered 2012-10-22: 100 mL via INTRAVENOUS

## 2012-10-22 MED ORDER — IOHEXOL 300 MG/ML  SOLN
50.0000 mL | Freq: Once | INTRAMUSCULAR | Status: AC | PRN
  Administered 2012-10-22: 50 mL via ORAL

## 2012-10-22 NOTE — Consult Note (Signed)
Reason for Consult:Abdominal pain  Referring Physician: Roxy Horseman PA-C  Sydney Hale is an 42 y.o. female.  HPI: This is a normally healthy woman who has a 42 y/o with CP and 2 other children she cares for daily.  2 days ago she started having right sided pain, and some nausea.  Pain was pretty bad and she came to the ER for evaluation.  Work up included a CT scan without contrast, and a pelvic US.  A 2.3 cm right ovarian cyst was found, pelvic ultrasound showed a normal right ovary, on evidence of torsion.  Labs at that time showed an elevated WBC but were otherwise normal.  LFT's and lipase are normal.  She went home and yesterday she thought she was somewhat better in the AM but the pain returned and now it was more localized in the RLQ.  She took Motrin 800 mg in AM and PM, with no significant effect. She has not been able to eat.  She has had some soup yesterday, she has nausea and vomited twice yesterday.   She took Motrin 800 mg again this AM and pain is worse.  She has had some nausea, and dry heaves, no vomiting.  She returned to the ER again today and CT with contrast shows some free pelvic fluid by no other abnormalities.  Appendix, and bowel show nothing to indicate any type of infection, perforation, or stones.  Ultra sound again shows normal right ovary, with complex fluid in the pelvis.  It is compatible with a ruptured ovarian cyst, but not diagnostic. We were ask to see.  History reviewed. No pertinent past medical history.  Past Surgical History  Procedure Laterality Date  . Cesarean section      x2  . Tubal ligation She had the left tube removed at this time, but the ovary was left in place.  9 years ago    No family history on file.  Social History:  reports that she has never smoked. She does not have any smokeless tobacco history on file. She reports that she does not drink alcohol or use illicit drugs.  Allergies: No Known Allergies  Medications:  Prior to  Admission:  (Not in a hospital admission) Scheduled: Continuous: PRN:   Anti-infectives   None      Results for orders placed during the hospital encounter of 10/22/12 (from the past 48 hour(s))  CBC WITH DIFFERENTIAL     Status: None   Collection Time    10/22/12 11:10 AM      Result Value Range   WBC 10.2  4.0 - 10.5 K/uL   RBC 4.70  3.87 - 5.11 MIL/uL   Hemoglobin 12.2  12.0 - 15.0 g/dL   HCT 16.1  09.6 - 04.5 %   MCV 78.3  78.0 - 100.0 fL   MCH 26.0  26.0 - 34.0 pg   MCHC 33.2  30.0 - 36.0 g/dL   RDW 40.9  81.1 - 91.4 %   Platelets 217  150 - 400 K/uL   Neutrophils Relative % 76  43 - 77 %   Neutro Abs 7.7  1.7 - 7.7 K/uL   Lymphocytes Relative 16  12 - 46 %   Lymphs Abs 1.6  0.7 - 4.0 K/uL   Monocytes Relative 7  3 - 12 %   Monocytes Absolute 0.7  0.1 - 1.0 K/uL   Eosinophils Relative 2  0 - 5 %   Eosinophils Absolute 0.2  0.0 -  0.7 K/uL   Basophils Relative 0  0 - 1 %   Basophils Absolute 0.0  0.0 - 0.1 K/uL  COMPREHENSIVE METABOLIC PANEL     Status: Abnormal   Collection Time    10/22/12 11:10 AM      Result Value Range   Sodium 137  135 - 145 mEq/L   Potassium 3.7  3.5 - 5.1 mEq/L   Chloride 104  96 - 112 mEq/L   CO2 25  19 - 32 mEq/L   Glucose, Bld 101 (*) 70 - 99 mg/dL   BUN 9  6 - 23 mg/dL   Creatinine, Ser 2.13  0.50 - 1.10 mg/dL   Calcium 9.6  8.4 - 08.6 mg/dL   Total Protein 6.8  6.0 - 8.3 g/dL   Albumin 3.8  3.5 - 5.2 g/dL   AST 23  0 - 37 U/L   ALT 22  0 - 35 U/L   Alkaline Phosphatase 43  39 - 117 U/L   Total Bilirubin 0.5  0.3 - 1.2 mg/dL   GFR calc non Af Amer 78 (*) >90 mL/min   GFR calc Af Amer >90  >90 mL/min   Comment: (NOTE)     The eGFR has been calculated using the CKD EPI equation.     This calculation has not been validated in all clinical situations.     eGFR's persistently <90 mL/min signify possible Chronic Kidney     Disease.  LIPASE, BLOOD     Status: None   Collection Time    10/22/12 11:10 AM      Result Value Range    Lipase 25  11 - 59 U/L    Ct Abdomen Pelvis Wo Contrast  10/20/2012   CLINICAL DATA:  Right-sided flank pain rule out stones.  EXAM: CT ABDOMEN AND PELVIS WITHOUT CONTRAST  TECHNIQUE: Multidetector CT imaging of the abdomen and pelvis was performed following the standard protocol without intravenous contrast.  COMPARISON:  None.  FINDINGS: LOWER CHEST:  Mediastinum: Unremarkable.  Lungs/pleura: No consolidation.  ABDOMEN/PELVIS:  Liver: No focal abnormality.  Biliary: No evidence of biliary obstruction or stone.  Pancreas: Unremarkable.  Spleen: Unremarkable.  Adrenals: Unremarkable.  Kidneys and ureters: No hydronephrosis or stone. Ureters are prominent but symmetric, possibly related to hydration status. No ureteral calculus seen.  Bladder: Unremarkable.  Bowel: No obstruction. Normal appendix.  Retroperitoneum: No mass or adenopathy.  Peritoneum: No free fluid or gas.  Reproductive: Dominant cyst or follicle in the right ovary, 2.3 cm in maximal diameter.  Vascular: No acute abnormality.  OSSEOUS: No acute abnormalities. No suspicious lytic or blastic lesions.  IMPRESSION: Negative for hydronephrosis or urolithiasis.   Electronically Signed   By: Tiburcio Pea   On: 10/20/2012 21:45   US Transvaginal Non-ob  10/22/2012   CLINICAL DATA:  Right lower quadrant pain. Question torsion.  EXAM: TRANSABDOMINAL AND TRANSVAGINAL ULTRASOUND OF PELVIS  DOPPLER ULTRASOUND OF OVARIES  TECHNIQUE: Both transabdominal and transvaginal ultrasound examinations of the pelvis were performed. Transabdominal technique was performed for global imaging of the pelvis including uterus, ovaries, adnexal regions, and pelvic cul-de-sac.  It was necessary to proceed with endovaginal exam following the transabdominal exam to visualize the ovaries, uterus, endometrium. Color and duplex Doppler ultrasound was utilized to evaluate blood flow to the ovaries.  COMPARISON:  CT earlier today.  FINDINGS: Uterus  Measurements: 9.1 x 5.6 x 5.3  cm. 5 mm cystic area within the myometrium. No fibroids or other mass visualized.  Endometrium  Thickness: 6 mm in thickness. Fluid within the endometrial canal. No focal abnormality visualized.  Right ovary  Measurements: 3.6 x 1.9 x 3.6 cm. Small follicle. Normal arterial and venous blood flow. Normal appearance/no adnexal mass.  Left ovary  Measurements: Not visualized. No adnexal mass.  Pulsed Doppler evaluation of both ovaries demonstrates normal low-resistance arterial and venous waveforms on the right. Nonvisualization of the left ovary.  Other findings: Moderate complex full free fluid in the pelvis. This could be related to a ruptured ovarian cyst.  IMPRESSION: Normal blood flow to the right ovary. Left ovary not visualized.  Complex free fluid in the pelvis, nonspecific, but would be compatible with ruptured ovarian cyst.  No sonographic evidence for ovarian torsion.   Electronically Signed   By: Charlett Nose M.D.   On: 10/22/2012 14:21   US Transvaginal Non-ob  10/21/2012   CLINICAL DATA:  Right-sided flank pain. Evaluate for ovarian torsion. Right ovarian cyst seen on CT examination.  EXAM: TRANSABDOMINAL AND TRANSVAGINAL ULTRASOUND OF PELVIS  DOPPLER ULTRASOUND OF OVARIES  TECHNIQUE: Both transabdominal and transvaginal ultrasound examinations of the pelvis were performed. Transabdominal technique was performed for global imaging of the pelvis including uterus, ovaries, adnexal regions, and pelvic cul-de-sac.  It was necessary to proceed with endovaginal exam following the transabdominal exam to visualize the ovaries. Color and duplex Doppler ultrasound was utilized to evaluate blood flow to the ovaries.  COMPARISON:  CT of the abdomen and pelvis 10/20/2012.  FINDINGS: Uterus: Retroverted uterus measuring 7.7 x 5.0 x 5.4 cm. No focal lesions.  Endometrium: 8.9 mm thick. Small amount of fluid in the endometrial canal.  Right ovary: Normal in echotexture and appearance with multiple small follicles. A  dominant follicle measuring 2.1 x 1.8 x 1.6 cm incidentally noted.  Left ovary:  Could not be visualized.  Pulsed Doppler evaluation of the right ovary demonstrates normal low-resistance arterial and venous waveforms.  No significant free fluid in the cul-de-sac. Multiple dilated vessels throughout the left adnexa.  IMPRESSION: 1. Limited examination which did not adequately visualize the left ovary. 2. Normal sonographic appearance of the right ovary. Specifically, no evidence of right ovarian torsion. 3. Dilated vessels throughout the left adnexal region are of uncertain etiology and significance, but can be seen in setting of pelvic congestion syndrome. Clinical correlation is recommended. 4. Additional incidental findings, as above.  No sonographic evidence for ovarian torsion.   Electronically Signed   By: Trudie Reed M.D.   On: 10/21/2012 00:32   US Pelvis Complete  10/22/2012   CLINICAL DATA:  Right lower quadrant pain. Question torsion.  EXAM: TRANSABDOMINAL AND TRANSVAGINAL ULTRASOUND OF PELVIS  DOPPLER ULTRASOUND OF OVARIES  TECHNIQUE: Both transabdominal and transvaginal ultrasound examinations of the pelvis were performed. Transabdominal technique was performed for global imaging of the pelvis including uterus, ovaries, adnexal regions, and pelvic cul-de-sac.  It was necessary to proceed with endovaginal exam following the transabdominal exam to visualize the ovaries, uterus, endometrium. Color and duplex Doppler ultrasound was utilized to evaluate blood flow to the ovaries.  COMPARISON:  CT earlier today.  FINDINGS: Uterus  Measurements: 9.1 x 5.6 x 5.3 cm. 5 mm cystic area within the myometrium. No fibroids or other mass visualized.  Endometrium  Thickness: 6 mm in thickness. Fluid within the endometrial canal. No focal abnormality visualized.  Right ovary  Measurements: 3.6 x 1.9 x 3.6 cm. Small follicle. Normal arterial and venous blood flow. Normal appearance/no adnexal mass.  Left ovary  Measurements: Not visualized. No adnexal mass.  Pulsed Doppler evaluation of both ovaries demonstrates normal low-resistance arterial and venous waveforms on the right. Nonvisualization of the left ovary.  Other findings: Moderate complex full free fluid in the pelvis. This could be related to a ruptured ovarian cyst.  IMPRESSION: Normal blood flow to the right ovary. Left ovary not visualized.  Complex free fluid in the pelvis, nonspecific, but would be compatible with ruptured ovarian cyst.  No sonographic evidence for ovarian torsion.   Electronically Signed   By: Charlett Nose M.D.   On: 10/22/2012 14:21   US Pelvis Complete  10/21/2012   CLINICAL DATA:  Right-sided flank pain. Evaluate for ovarian torsion. Right ovarian cyst seen on CT examination.  EXAM: TRANSABDOMINAL AND TRANSVAGINAL ULTRASOUND OF PELVIS  DOPPLER ULTRASOUND OF OVARIES  TECHNIQUE: Both transabdominal and transvaginal ultrasound examinations of the pelvis were performed. Transabdominal technique was performed for global imaging of the pelvis including uterus, ovaries, adnexal regions, and pelvic cul-de-sac.  It was necessary to proceed with endovaginal exam following the transabdominal exam to visualize the ovaries. Color and duplex Doppler ultrasound was utilized to evaluate blood flow to the ovaries.  COMPARISON:  CT of the abdomen and pelvis 10/20/2012.  FINDINGS: Uterus: Retroverted uterus measuring 7.7 x 5.0 x 5.4 cm. No focal lesions.  Endometrium: 8.9 mm thick. Small amount of fluid in the endometrial canal.  Right ovary: Normal in echotexture and appearance with multiple small follicles. A dominant follicle measuring 2.1 x 1.8 x 1.6 cm incidentally noted.  Left ovary:  Could not be visualized.  Pulsed Doppler evaluation of the right ovary demonstrates normal low-resistance arterial and venous waveforms.  No significant free fluid in the cul-de-sac. Multiple dilated vessels throughout the left adnexa.  IMPRESSION: 1. Limited  examination which did not adequately visualize the left ovary. 2. Normal sonographic appearance of the right ovary. Specifically, no evidence of right ovarian torsion. 3. Dilated vessels throughout the left adnexal region are of uncertain etiology and significance, but can be seen in setting of pelvic congestion syndrome. Clinical correlation is recommended. 4. Additional incidental findings, as above.  No sonographic evidence for ovarian torsion.   Electronically Signed   By: Trudie Reed M.D.   On: 10/21/2012 00:32   Ct Abdomen Pelvis W Contrast  10/22/2012   *RADIOLOGY REPORT*  Clinical Data: Right lower quadrant abdominal pain, nausea and vomiting  CT ABDOMEN AND PELVIS WITH CONTRAST  Technique:  Multidetector CT imaging of the abdomen and pelvis was performed following the standard protocol during bolus administration of intravenous contrast.  Contrast: 50mL OMNIPAQUE IOHEXOL 300 MG/ML  SOLN, OMNIPAQUE IOHEXOL 300 MG/ML  SOLN  Comparison: 10/20/2012  Findings: The lung bases are clear.  Liver, gallbladder, adrenal glands, right kidney, spleen, and pancreas are unremarkable.  3 mm too small to characterize left mid renal cortical hypodensity image 27.  No radiopaque renal, ureteral, or bladder calculus.  Appendix and bowel are normal.  No free air or lymphadenopathy.  Small low density free pelvic fluid is identified.  The uterus and ovaries are normal.  No pelvic lymphadenopathy.  No acute osseous abnormality.  IMPRESSION: No acute intra-abdominal or pelvic pathology.  Small free pelvic fluid which is likely physiologic in this premenopausal patient.  Specifically, the appendix appears normal.   Original Report Authenticated By: Christiana Pellant, M.D.   [place] Art/ven Flow Abd Pelv Doppler  10/22/2012   CLINICAL DATA:  Right lower quadrant pain. Question torsion.  EXAM: TRANSABDOMINAL AND TRANSVAGINAL  ULTRASOUND OF PELVIS  DOPPLER ULTRASOUND OF OVARIES  TECHNIQUE: Both transabdominal and transvaginal  ultrasound examinations of the pelvis were performed. Transabdominal technique was performed for global imaging of the pelvis including uterus, ovaries, adnexal regions, and pelvic cul-de-sac.  It was necessary to proceed with endovaginal exam following the transabdominal exam to visualize the ovaries, uterus, endometrium. Color and duplex Doppler ultrasound was utilized to evaluate blood flow to the ovaries.  COMPARISON:  CT earlier today.  FINDINGS: Uterus  Measurements: 9.1 x 5.6 x 5.3 cm. 5 mm cystic area within the myometrium. No fibroids or other mass visualized.  Endometrium  Thickness: 6 mm in thickness. Fluid within the endometrial canal. No focal abnormality visualized.  Right ovary  Measurements: 3.6 x 1.9 x 3.6 cm. Small follicle. Normal arterial and venous blood flow. Normal appearance/no adnexal mass.  Left ovary  Measurements: Not visualized. No adnexal mass.  Pulsed Doppler evaluation of both ovaries demonstrates normal low-resistance arterial and venous waveforms on the right. Nonvisualization of the left ovary.  Other findings: Moderate complex full free fluid in the pelvis. This could be related to a ruptured ovarian cyst.  IMPRESSION: Normal blood flow to the right ovary. Left ovary not visualized.  Complex free fluid in the pelvis, nonspecific, but would be compatible with ruptured ovarian cyst.  No sonographic evidence for ovarian torsion.   Electronically Signed   By: Charlett Nose M.D.   On: 10/22/2012 14:21   [place] Art/ven Flow Abd Pelv Doppler  10/21/2012   CLINICAL DATA:  Right-sided flank pain. Evaluate for ovarian torsion. Right ovarian cyst seen on CT examination.  EXAM: TRANSABDOMINAL AND TRANSVAGINAL ULTRASOUND OF PELVIS  DOPPLER ULTRASOUND OF OVARIES  TECHNIQUE: Both transabdominal and transvaginal ultrasound examinations of the pelvis were performed. Transabdominal technique was performed for global imaging of the pelvis including uterus, ovaries, adnexal regions, and pelvic  cul-de-sac.  It was necessary to proceed with endovaginal exam following the transabdominal exam to visualize the ovaries. Color and duplex Doppler ultrasound was utilized to evaluate blood flow to the ovaries.  COMPARISON:  CT of the abdomen and pelvis 10/20/2012.  FINDINGS: Uterus: Retroverted uterus measuring 7.7 x 5.0 x 5.4 cm. No focal lesions.  Endometrium: 8.9 mm thick. Small amount of fluid in the endometrial canal.  Right ovary: Normal in echotexture and appearance with multiple small follicles. A dominant follicle measuring 2.1 x 1.8 x 1.6 cm incidentally noted.  Left ovary:  Could not be visualized.  Pulsed Doppler evaluation of the right ovary demonstrates normal low-resistance arterial and venous waveforms.  No significant free fluid in the cul-de-sac. Multiple dilated vessels throughout the left adnexa.  IMPRESSION: 1. Limited examination which did not adequately visualize the left ovary. 2. Normal sonographic appearance of the right ovary. Specifically, no evidence of right ovarian torsion. 3. Dilated vessels throughout the left adnexal region are of uncertain etiology and significance, but can be seen in setting of pelvic congestion syndrome. Clinical correlation is recommended. 4. Additional incidental findings, as above.  No sonographic evidence for ovarian torsion.   Electronically Signed   By: Trudie Reed M.D.   On: 10/21/2012 00:32    Review of Systems  Constitutional: Positive for weight loss. Negative for fever and chills (she has been on a diet, and has lost some weight.).  HENT: Negative.   Eyes: Negative.   Respiratory: Negative.   Cardiovascular: Negative.   Gastrointestinal: Positive for nausea, vomiting and abdominal pain. Negative for heartburn, diarrhea, constipation, blood in stool and melena.  Genitourinary: Negative.   Musculoskeletal:       Some occasional back pain and muscle pain, she carries her 68 y/o CP child as needed.  Skin: Negative.    Endo/Heme/Allergies: Negative.   Psychiatric/Behavioral: Negative.    Blood pressure 109/58, pulse 68, temperature 98.1 F (36.7 C), temperature source Oral, resp. rate 20, last menstrual period 10/06/2012, SpO2 100.00%. Physical Exam  Constitutional: She appears well-developed and well-nourished. No distress.  HENT:  Head: Normocephalic and atraumatic.  Nose: Nose normal.  Eyes: Conjunctivae and EOM are normal. Pupils are equal, round, and reactive to light. Right eye exhibits no discharge. Left eye exhibits no discharge. No scleral icterus.  Neck: Normal range of motion. Neck supple. No JVD present. No tracheal deviation present. No thyromegaly present.  Cardiovascular: Normal rate, regular rhythm, normal heart sounds and intact distal pulses.  Exam reveals no gallop.   No murmur heard. Respiratory: Breath sounds normal. No respiratory distress. She has no wheezes. She has no rales. She exhibits no tenderness.  GI: Soft. Bowel sounds are normal. She exhibits no distension and no mass. There is tenderness (RLQ and pain LLQ going to the right with palpation). There is no rebound and no guarding.  Lymphadenopathy:    She has no cervical adenopathy.  Skin: Skin is warm and dry. No rash noted. She is not diaphoretic. No erythema. No pallor.  Psychiatric: She has a normal mood and affect. Her behavior is normal. Judgment and thought content normal.    Assessment/Plan: 1.  Right abdominal pain, nausea and some vomiting. 2. Possible right ovarian cyst rupture.  Plan:  Pt has nothing on CT or [place] to suggest an appendicitis, diverticulitis, pancreatitis or perforation.            She has been seen and examined by Dr. Donell Beers and at this point there is nothing surgical from our standpoint to             address.  She recommends she be seen by her GYN for evaluation and treatment.  Sydney Hale 10/22/2012, 4:43 PM

## 2012-10-22 NOTE — ED Notes (Signed)
Bed: WA20 Expected date:  Expected time:  Means of arrival:  Comments: Hold for triage 1 

## 2012-10-22 NOTE — ED Notes (Signed)
Per pt, was seen here on Tuesday for same symptoms-increased nausea-PCP told her to return to ED

## 2012-10-22 NOTE — Consult Note (Signed)
Transfer accepted from [facility] ED for acute abdominal/pelvic pain with fluid in pelvis from suspected ruptured ovarian cyst, needing inpatient pain management.   Sydney Hale is an 42 y.o. female.  She is G2P3 with C/section x 2, left salpingectomy and right tubal ligation at Laparoscopy done for off and on LLQ pain (tube was adhesed and twisted) in [place], she is s/p endometrial ablation for menorrhagia about 10 yrs back with regular monthly periods that are lighter since ablation with LMP 1st wk of Sept, regular period.  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 Does not have medical problems or recent acute Gynecologic problems. Has prior Colonoscopy 6 yrs back due to family h/o colon cancer.  She was doing well until sudden onset of RLQ/flank/side pain started on Tues-2 days back when she went to [facility]ED and had normal pelvic sono, was sent home with Percocet that she didn't tolerate well due to itching. She returned to ED today with worsening pain, more in RLQ, severe nausea and pelvic pressure, pain radiating to lower abdomen, some right back and LLQ. Denies bowel complaints including bloody stool, tarry stools, no hematuria or renal stone hx. Denies fever/ chills. Has severe nausea, no vomiting. She was given several doses of Dilaudid after CT was done with minimal relief and due to pelvic fluid noted at CT with presumed diagnosis of ruptured ovarian cyst, she was transferred to our service. Pt sees NP [employer] in [employer] office.   Past Medical History  Diagnosis Date  . PONV (postoperative nausea and vomiting)   . Abdominal pain, RLQ 10/22/2012  . S/P endometrial ablation   . S/P tubal ligation    Past Surgical History  Procedure Laterality Date  . Cesarean section      x2  . Tubal ligation She had the left tube removed at this time, but the ovary was left in place.  9 years ago   Family  History  Problem Relation Age of Onset  . Hyperlipidemia Mother   . Diabetes Mother   . Hyperlipidemia Father   . Cancer Paternal Grandmother    Social History:  reports that she has never smoked. She does not have any smokeless tobacco history on file. She reports that she does not drink alcohol or use illicit drugs.  Allergies: No Known Allergies  Medications:  Prior to Admission:  Prescriptions prior to admission  Medication Sig Dispense Refill  . ibuprofen (ADVIL,MOTRIN) 200 MG tablet Take 600 mg by mouth every 6 (six) hours as needed for pain.       Marland Kitchen oxyCODONE-acetaminophen (PERCOCET/ROXICET) 5-325 MG per tablet Take 1 tablet by mouth every 6 (six) hours as needed for pain.  15 tablet  0   RUE:AVWUJWJXBJYNW (DILAUDID) injection, promethazine\  Review of Systems  Constitutional: Positive for weight loss. Negative for fever and chills (she has been on a diet, and has lost some weight.).  HENT: Negative.   Eyes: Negative.   Respiratory: Negative.   Cardiovascular: Negative.   Gastrointestinal: Positive for nausea, vomiting and abdominal pain. Negative for heartburn, diarrhea, constipation, blood in stool and melena.  Genitourinary: Negative.   Musculoskeletal:       Some occasional back pain and muscle pain, she carries her 42 y/o CP child as needed.  Skin: Negative.   Endo/Heme/Allergies: Negative.   Psychiatric/Behavioral: Negative.    BP 114/68  Pulse 68  Temp(Src) 99 F (37.2 C) (Oral)  Resp 16  Ht 5\' 10"  (1.778 m)  Wt 194 lb 8 oz (88.225 kg)  BMI 27.91 kg/m2  SpO2 100%  LMP 10/06/2012  Physical Exam  Constitutional: She appears well-developed and well-nourished. No distress.  HENT:  Head: Normocephalic and atraumatic.  Nose: Nose normal.  Eyes: Conjunctivae and EOM are normal. Pupils are equal, round, and reactive to light. Right eye exhibits no discharge. Left eye exhibits no discharge. No scleral icterus.  Neck: Normal range of motion. Neck supple. No JVD  present. No tracheal deviation present. No thyromegaly present.  Cardiovascular: Normal rate, regular rhythm, normal heart sounds and intact distal pulses.  Exam reveals no gallop.   No murmur heard. Respiratory: Breath sounds normal. No respiratory distress. She has no wheezes. She has no rales. She exhibits no tenderness.  GI: Soft. Bowel sounds are normal. She exhibits no distension and no mass. There is tenderness (RLQ and pain LLQ going to the right with palpation). There is no rebound and no guarding.  Lymphadenopathy:    She has no cervical adenopathy.  Skin: Skin is warm and dry. No rash noted. She is not diaphoretic. No erythema. No pallor.  Psychiatric: She has a normal mood and affect. Her behavior is normal. Judgment and thought content normal.  Pelvic exam was deferred.   Ct Abdomen Pelvis Wo Contrast 10/20/2012    CLINICAL DATA:  Right-sided flank pain rule out stones.  EXAM: CT ABDOMEN AND PELVIS WITHOUT CONTRAST  TECHNIQUE: Multidetector CT imaging of the abdomen and pelvis was performed following the standard protocol without intravenous contrast.  COMPARISON:  None.  FINDINGS: LOWER CHEST:  Mediastinum: Unremarkable.  Lungs/pleura: No consolidation.  ABDOMEN/PELVIS:  Liver: No focal abnormality.  Biliary: No evidence of biliary obstruction or stone.  Pancreas: Unremarkable.  Spleen: Unremarkable.  Adrenals: Unremarkable.  Kidneys and ureters: No hydronephrosis or stone. Ureters are prominent but symmetric, possibly related to hydration status. No ureteral calculus seen.  Bladder: Unremarkable.  Bowel: No obstruction. Normal appendix.  Retroperitoneum: No mass or adenopathy.  Peritoneum: No free fluid or gas.  Reproductive: Dominant cyst or follicle in the right ovary, 2.3 cm in maximal diameter.  Vascular: No acute abnormality.  OSSEOUS: No acute abnormalities. No suspicious lytic or blastic lesions.  IMPRESSION: Negative for hydronephrosis or urolithiasis.   Electronically Signed   By:  Tiburcio Pea   On: 10/20/2012 21:45   US Transvaginal Non-ob 10/22/2012    CLINICAL DATA:  Right lower quadrant pain. Question torsion.  EXAM: TRANSABDOMINAL AND TRANSVAGINAL ULTRASOUND OF PELVIS  DOPPLER ULTRASOUND OF OVARIES  TECHNIQUE: Both transabdominal and transvaginal ultrasound examinations of the pelvis were performed. Transabdominal technique was performed for global imaging of the pelvis including uterus, ovaries, adnexal regions, and pelvic cul-de-sac.  It was necessary to proceed with endovaginal exam following the transabdominal exam to visualize the ovaries, uterus, endometrium. Color and duplex Doppler ultrasound was utilized to evaluate blood flow to the ovaries.  COMPARISON:  CT earlier today.  FINDINGS: Uterus  Measurements: 9.1 x 5.6 x 5.3 cm. 5 mm cystic area within the myometrium. No fibroids or other mass visualized.  Endometrium  Thickness: 6 mm in thickness. Fluid within the endometrial canal. No focal abnormality visualized.  Right ovary  Measurements: 3.6 x 1.9 x 3.6 cm. Small follicle. Normal arterial and venous blood flow. Normal appearance/no adnexal mass.  Left ovary  Measurements: Not visualized. No adnexal mass.  Pulsed Doppler evaluation of both ovaries demonstrates normal low-resistance  arterial and venous waveforms on the right. Nonvisualization of the left ovary.  Other findings: Moderate complex full free fluid in the pelvis. This could be related to a ruptured ovarian cyst.  IMPRESSION: Normal blood flow to the right ovary. Left ovary not visualized.  Complex free fluid in the pelvis, nonspecific, but would be compatible with ruptured ovarian cyst.  No sonographic evidence for ovarian torsion.   Electronically Signed   By: Charlett Nose M.D.   On: 10/22/2012 14:21   US Transvaginal Non-ob  10/21/2012    CLINICAL DATA:  Right-sided flank pain. Evaluate for ovarian torsion. Right ovarian cyst seen on CT examination.  EXAM: TRANSABDOMINAL AND TRANSVAGINAL ULTRASOUND OF  PELVIS  DOPPLER ULTRASOUND OF OVARIES  TECHNIQUE: Both transabdominal and transvaginal ultrasound examinations of the pelvis were performed. Transabdominal technique was performed for global imaging of the pelvis including uterus, ovaries, adnexal regions, and pelvic cul-de-sac.  It was necessary to proceed with endovaginal exam following the transabdominal exam to visualize the ovaries. Color and duplex Doppler ultrasound was utilized to evaluate blood flow to the ovaries.  COMPARISON:  CT of the abdomen and pelvis 10/20/2012.  FINDINGS: Uterus: Retroverted uterus measuring 7.7 x 5.0 x 5.4 cm. No focal lesions.  Endometrium: 8.9 mm thick. Small amount of fluid in the endometrial canal.  Right ovary: Normal in echotexture and appearance with multiple small follicles. A dominant follicle measuring 2.1 x 1.8 x 1.6 cm incidentally noted.  Left ovary:  Could not be visualized.  Pulsed Doppler evaluation of the right ovary demonstrates normal low-resistance arterial and venous waveforms.  No significant free fluid in the cul-de-sac. Multiple dilated vessels throughout the left adnexa.  IMPRESSION: 1. Limited examination which did not adequately visualize the left ovary. 2. Normal sonographic appearance of the right ovary. Specifically, no evidence of right ovarian torsion. 3. Dilated vessels throughout the left adnexal region are of uncertain etiology and significance, but can be seen in setting of pelvic congestion syndrome. Clinical correlation is recommended. 4. Additional incidental findings, as above.  No sonographic evidence for ovarian torsion.   Electronically Signed   By: Trudie Reed M.D.   On: 10/21/2012 00:32   Ct Abdomen Pelvis W Contrast 10/22/2012    *RADIOLOGY REPORT*  Clinical Data: Right lower quadrant abdominal pain, nausea and vomiting  CT ABDOMEN AND PELVIS WITH CONTRAST  Technique:  Multidetector CT imaging of the abdomen and pelvis was performed following the standard protocol during bolus  administration of intravenous contrast.  Contrast: 50mL OMNIPAQUE IOHEXOL 300 MG/ML  SOLN, OMNIPAQUE IOHEXOL 300 MG/ML  SOLN  Comparison: 10/20/2012  Findings: The lung bases are clear.  Liver, gallbladder, adrenal glands, right kidney, spleen, and pancreas are unremarkable.  3 mm too small to characterize left mid renal cortical hypodensity image 27.  No radiopaque renal, ureteral, or bladder calculus.  Appendix and bowel are normal.  No free air or lymphadenopathy.  Small low density free pelvic fluid is identified.  The uterus and ovaries are normal.  No pelvic lymphadenopathy.  No acute osseous abnormality.  IMPRESSION: No acute intra-abdominal or pelvic pathology.  Small free pelvic fluid which is likely physiologic in this premenopausal patient.  Specifically, the appendix appears normal.   Original Report Authenticated By: Christiana Pellant, M.D.   [place] Art/ven Flow Abd Pelv Doppler 10/22/2012    CLINICAL DATA:  Right lower quadrant pain. Question torsion.  EXAM: TRANSABDOMINAL AND TRANSVAGINAL ULTRASOUND OF PELVIS  DOPPLER ULTRASOUND OF OVARIES  TECHNIQUE: Both transabdominal and transvaginal  ultrasound examinations of the pelvis were performed. Transabdominal technique was performed for global imaging of the pelvis including uterus, ovaries, adnexal regions, and pelvic cul-de-sac.  It was necessary to proceed with endovaginal exam following the transabdominal exam to visualize the ovaries, uterus, endometrium. Color and duplex Doppler ultrasound was utilized to evaluate blood flow to the ovaries.  COMPARISON:  CT earlier today.  FINDINGS: Uterus  Measurements: 9.1 x 5.6 x 5.3 cm. 5 mm cystic area within the myometrium. No fibroids or other mass visualized.  Endometrium  Thickness: 6 mm in thickness. Fluid within the endometrial canal. No focal abnormality visualized.  Right ovary  Measurements: 3.6 x 1.9 x 3.6 cm. Small follicle. Normal arterial and venous blood flow. Normal appearance/no adnexal mass.   Left ovary  Measurements: Not visualized. No adnexal mass.  Pulsed Doppler evaluation of both ovaries demonstrates normal low-resistance arterial and venous waveforms on the right. Nonvisualization of the left ovary.  Other findings: Moderate complex full free fluid in the pelvis. This could be related to a ruptured ovarian cyst.  IMPRESSION: Normal blood flow to the right ovary. Left ovary not visualized.  Complex free fluid in the pelvis, nonspecific, but would be compatible with ruptured ovarian cyst.  No sonographic evidence for ovarian torsion.   Electronically Signed   By: Charlett Nose M.D.   On: 10/22/2012 14:21   [place] Art/ven Flow Abd Pelv Doppler 10/21/2012    CLINICAL DATA:  Right-sided flank pain. Evaluate for ovarian torsion. Right ovarian cyst seen on CT examination.  EXAM: TRANSABDOMINAL AND TRANSVAGINAL ULTRASOUND OF PELVIS  DOPPLER ULTRASOUND OF OVARIES  TECHNIQUE: Both transabdominal and transvaginal ultrasound examinations of the pelvis were performed. Transabdominal technique was performed for global imaging of the pelvis including uterus, ovaries, adnexal regions, and pelvic cul-de-sac.  It was necessary to proceed with endovaginal exam following the transabdominal exam to visualize the ovaries. Color and duplex Doppler ultrasound was utilized to evaluate blood flow to the ovaries.  COMPARISON:  CT of the abdomen and pelvis 10/20/2012.  FINDINGS: Uterus: Retroverted uterus measuring 7.7 x 5.0 x 5.4 cm. No focal lesions.  Endometrium: 8.9 mm thick. Small amount of fluid in the endometrial canal.  Right ovary: Normal in echotexture and appearance with multiple small follicles. A dominant follicle measuring 2.1 x 1.8 x 1.6 cm incidentally noted.  Left ovary:  Could not be visualized.  Pulsed Doppler evaluation of the right ovary demonstrates normal low-resistance arterial and venous waveforms.  No significant free fluid in the cul-de-sac. Multiple dilated vessels throughout the left adnexa.   IMPRESSION: 1. Limited examination which did not adequately visualize the left ovary. 2. Normal sonographic appearance of the right ovary. Specifically, no evidence of right ovarian torsion. 3. Dilated vessels throughout the left adnexal region are of uncertain etiology and significance, but can be seen in setting of pelvic congestion syndrome. Clinical correlation is recommended. 4. Additional incidental findings, as above.  No sonographic evidence for ovarian torsion.   Electronically Signed   By: Trudie Reed M.D.   On: 10/21/2012 00:32    Assessment/Plan: 1. Acute onset right abdominal pain, nausea and some vomiting. 2. Possible right ovarian cyst rupture with peritoneal irritation from fluid causing pain and nausea. Other causes including renal stone, colitis,  Plan- Dilaudid for pain, Zofran, Phenergan, NPO, IV Fluids, reassess in AM, CBCD in am.   Cherie Lasalle R 10/22/2012, 9:19 PM

## 2012-10-22 NOTE — Telephone Encounter (Signed)
Patient Information:  Caller Name: Tyauna  Phone: [phone]  Patient: Sydney Hale, Sydney Hale  Gender: Female  DOB: [birth year]  Age: 42 Years  PCP: Eleonore Chiquito (Family Practice > 28yrs old)  Pregnant: No  Office Follow Up:  Does the office need to follow up with this patient?: No  Instructions For The Office: N/A  RN Note:  Pt has RLQ pain onset 9-16, was seen in ED, ruled out Kidney stones, unable to check for Apendicitis d/t MD advised Pt they would need to use IV Dye and wouldn't be able to rule out Kidney stone.  Pt's pain is worse in RLQ w/ nausea.  Pt denies fever, vomiting. Advised Pt to go back to ED w/ worsen RLQ pain and nausea.  Pt verbalized understanding.  Symptoms  Reason For Call & Symptoms: Abdominal Pain, RLQ  Reviewed Health History In EMR: Yes  Reviewed Medications In EMR: Yes  Reviewed Allergies In EMR: Yes  Reviewed Surgeries / Procedures: Yes  Date of Onset of Symptoms: 10/20/2012  Treatments Tried: Motrin, Percocet  Treatments Tried Worked: No OB / GYN:  LMP: 10/06/2012  Guideline(s) Used:  Abdominal Pain - Female  Disposition Per Guideline:   Go to ED Now (or to Office with PCP Approval)  Reason For Disposition Reached:   Constant abdominal pain lasting > 2 hours  Advice Given:  N/A  Patient Will Follow Care Advice:  YES

## 2012-10-22 NOTE — ED Provider Notes (Signed)
Care of the patient was assumed from the previous team of providers.  On my repeat exam the patient remains uncomfortable appearing.  We again discussed all lab findings, ultrasound findings. Patient relates that she is nauseous with persistent abdominal pain. Additional medication ordered. I subsequently discussed the patient's case with our surgical team, and with our gynecologist.  The patient has her own gynecology team, and I contacted them.  With the absence of improvement following fluids, analgesics, antiemetics, the patient required admission.  She was transferred to [facility] hospital for further evaluation and management.    Gerhard Munch, MD 10/22/12 [phone]

## 2012-10-22 NOTE — ED Provider Notes (Signed)
CSN: [id number]     Arrival date & time 10/22/12  1013 History   First MD Initiated Contact with Patient 10/22/12 1032     Chief Complaint  Patient presents with  . right lower abdominal pain    (Consider location/radiation/quality/duration/timing/severity/associated sxs/prior Treatment) HPI Comments: Patient presents emergency department with chief complaint of right lower quadrant abdominal pain. She was seen 2 days ago for similar symptoms, but states that back then her pain was more on the flank. She states that the pain has now localized in the right lower quadrant. She states that she has had associated nausea, and one episode of vomiting. She denies fevers, or chills. Denies constipation or diarrhea. She has tried taking Percocet for pain relief, but this has not helped significantly. Patient states that she was told she did not have a kidney stone, and that her ovaries looked okay.  The history is provided by the patient. No language interpreter was used.    History reviewed. No pertinent past medical history. Past Surgical History  Procedure Laterality Date  . Cesarean section      x2  . Tubal ligation     No family history on file. History  Substance Use Topics  . Smoking status: Never Smoker   . Smokeless tobacco: Not on file  . Alcohol Use: No   OB History   Grav Para Term Preterm Abortions TAB SAB Ect Mult Living                 Review of Systems  All other systems reviewed and are negative.    Allergies  Review of patient's allergies indicates no known allergies.  Home Medications   Current Outpatient Rx  Name  Route  Sig  Dispense  Refill  . ibuprofen (ADVIL,MOTRIN) 200 MG tablet   Oral   Take 600 mg by mouth every 6 (six) hours as needed for pain.          Marland Kitchen oxyCODONE-acetaminophen (PERCOCET/ROXICET) 5-325 MG per tablet   Oral   Take 1 tablet by mouth every 6 (six) hours as needed for pain.   15 tablet   0    BP 118/75  Pulse 73  Temp(Src)  98.6 F (37 C) (Oral)  Resp 16  SpO2 100%  LMP 10/06/2012 Physical Exam  Nursing note and vitals reviewed. Constitutional: She is oriented to person, place, and time. She appears well-developed and well-nourished.  HENT:  Head: Normocephalic and atraumatic.  Eyes: Conjunctivae and EOM are normal. Pupils are equal, round, and reactive to light.  Neck: Normal range of motion. Neck supple.  Cardiovascular: Normal rate and regular rhythm.  Exam reveals no gallop and no friction rub.   No murmur heard. Pulmonary/Chest: Effort normal and breath sounds normal. No respiratory distress. She has no wheezes. She has no rales. She exhibits no tenderness.  Abdominal: Soft. Bowel sounds are normal. She exhibits no distension and no mass. There is tenderness. There is guarding. There is no rebound.  Positive McBurney point tenderness, positive Rovsing sign  Musculoskeletal: Normal range of motion. She exhibits no edema and no tenderness.  Neurological: She is alert and oriented to person, place, and time.  Skin: Skin is warm and dry.  Psychiatric: She has a normal mood and affect. Her behavior is normal. Judgment and thought content normal.    ED Course  Procedures (including critical care time) Labs Review Labs Reviewed  CBC WITH DIFFERENTIAL  COMPREHENSIVE METABOLIC PANEL  LIPASE, BLOOD   Imaging Review  Ct Abdomen Pelvis Wo Contrast  10/20/2012   CLINICAL DATA:  Right-sided flank pain rule out stones.  EXAM: CT ABDOMEN AND PELVIS WITHOUT CONTRAST  TECHNIQUE: Multidetector CT imaging of the abdomen and pelvis was performed following the standard protocol without intravenous contrast.  COMPARISON:  None.  FINDINGS: LOWER CHEST:  Mediastinum: Unremarkable.  Lungs/pleura: No consolidation.  ABDOMEN/PELVIS:  Liver: No focal abnormality.  Biliary: No evidence of biliary obstruction or stone.  Pancreas: Unremarkable.  Spleen: Unremarkable.  Adrenals: Unremarkable.  Kidneys and ureters: No hydronephrosis  or stone. Ureters are prominent but symmetric, possibly related to hydration status. No ureteral calculus seen.  Bladder: Unremarkable.  Bowel: No obstruction. Normal appendix.  Retroperitoneum: No mass or adenopathy.  Peritoneum: No free fluid or gas.  Reproductive: Dominant cyst or follicle in the right ovary, 2.3 cm in maximal diameter.  Vascular: No acute abnormality.  OSSEOUS: No acute abnormalities. No suspicious lytic or blastic lesions.  IMPRESSION: Negative for hydronephrosis or urolithiasis.   Electronically Signed   By: Tiburcio Pea   On: 10/20/2012 21:45   US Transvaginal Non-ob  10/21/2012   CLINICAL DATA:  Right-sided flank pain. Evaluate for ovarian torsion. Right ovarian cyst seen on CT examination.  EXAM: TRANSABDOMINAL AND TRANSVAGINAL ULTRASOUND OF PELVIS  DOPPLER ULTRASOUND OF OVARIES  TECHNIQUE: Both transabdominal and transvaginal ultrasound examinations of the pelvis were performed. Transabdominal technique was performed for global imaging of the pelvis including uterus, ovaries, adnexal regions, and pelvic cul-de-sac.  It was necessary to proceed with endovaginal exam following the transabdominal exam to visualize the ovaries. Color and duplex Doppler ultrasound was utilized to evaluate blood flow to the ovaries.  COMPARISON:  CT of the abdomen and pelvis 10/20/2012.  FINDINGS: Uterus: Retroverted uterus measuring 7.7 x 5.0 x 5.4 cm. No focal lesions.  Endometrium: 8.9 mm thick. Small amount of fluid in the endometrial canal.  Right ovary: Normal in echotexture and appearance with multiple small follicles. A dominant follicle measuring 2.1 x 1.8 x 1.6 cm incidentally noted.  Left ovary:  Could not be visualized.  Pulsed Doppler evaluation of the right ovary demonstrates normal low-resistance arterial and venous waveforms.  No significant free fluid in the cul-de-sac. Multiple dilated vessels throughout the left adnexa.  IMPRESSION: 1. Limited examination which did not adequately  visualize the left ovary. 2. Normal sonographic appearance of the right ovary. Specifically, no evidence of right ovarian torsion. 3. Dilated vessels throughout the left adnexal region are of uncertain etiology and significance, but can be seen in setting of pelvic congestion syndrome. Clinical correlation is recommended. 4. Additional incidental findings, as above.  No sonographic evidence for ovarian torsion.   Electronically Signed   By: Trudie Reed M.D.   On: 10/21/2012 00:32   US Pelvis Complete  10/21/2012   CLINICAL DATA:  Right-sided flank pain. Evaluate for ovarian torsion. Right ovarian cyst seen on CT examination.  EXAM: TRANSABDOMINAL AND TRANSVAGINAL ULTRASOUND OF PELVIS  DOPPLER ULTRASOUND OF OVARIES  TECHNIQUE: Both transabdominal and transvaginal ultrasound examinations of the pelvis were performed. Transabdominal technique was performed for global imaging of the pelvis including uterus, ovaries, adnexal regions, and pelvic cul-de-sac.  It was necessary to proceed with endovaginal exam following the transabdominal exam to visualize the ovaries. Color and duplex Doppler ultrasound was utilized to evaluate blood flow to the ovaries.  COMPARISON:  CT of the abdomen and pelvis 10/20/2012.  FINDINGS: Uterus: Retroverted uterus measuring 7.7 x 5.0 x 5.4 cm. No focal lesions.  Endometrium: 8.9 mm  thick. Small amount of fluid in the endometrial canal.  Right ovary: Normal in echotexture and appearance with multiple small follicles. A dominant follicle measuring 2.1 x 1.8 x 1.6 cm incidentally noted.  Left ovary:  Could not be visualized.  Pulsed Doppler evaluation of the right ovary demonstrates normal low-resistance arterial and venous waveforms.  No significant free fluid in the cul-de-sac. Multiple dilated vessels throughout the left adnexa.  IMPRESSION: 1. Limited examination which did not adequately visualize the left ovary. 2. Normal sonographic appearance of the right ovary. Specifically, no  evidence of right ovarian torsion. 3. Dilated vessels throughout the left adnexal region are of uncertain etiology and significance, but can be seen in setting of pelvic congestion syndrome. Clinical correlation is recommended. 4. Additional incidental findings, as above.  No sonographic evidence for ovarian torsion.   Electronically Signed   By: Trudie Reed M.D.   On: 10/21/2012 00:32   [place] Art/ven Flow Abd Pelv Doppler  10/21/2012   CLINICAL DATA:  Right-sided flank pain. Evaluate for ovarian torsion. Right ovarian cyst seen on CT examination.  EXAM: TRANSABDOMINAL AND TRANSVAGINAL ULTRASOUND OF PELVIS  DOPPLER ULTRASOUND OF OVARIES  TECHNIQUE: Both transabdominal and transvaginal ultrasound examinations of the pelvis were performed. Transabdominal technique was performed for global imaging of the pelvis including uterus, ovaries, adnexal regions, and pelvic cul-de-sac.  It was necessary to proceed with endovaginal exam following the transabdominal exam to visualize the ovaries. Color and duplex Doppler ultrasound was utilized to evaluate blood flow to the ovaries.  COMPARISON:  CT of the abdomen and pelvis 10/20/2012.  FINDINGS: Uterus: Retroverted uterus measuring 7.7 x 5.0 x 5.4 cm. No focal lesions.  Endometrium: 8.9 mm thick. Small amount of fluid in the endometrial canal.  Right ovary: Normal in echotexture and appearance with multiple small follicles. A dominant follicle measuring 2.1 x 1.8 x 1.6 cm incidentally noted.  Left ovary:  Could not be visualized.  Pulsed Doppler evaluation of the right ovary demonstrates normal low-resistance arterial and venous waveforms.  No significant free fluid in the cul-de-sac. Multiple dilated vessels throughout the left adnexa.  IMPRESSION: 1. Limited examination which did not adequately visualize the left ovary. 2. Normal sonographic appearance of the right ovary. Specifically, no evidence of right ovarian torsion. 3. Dilated vessels throughout the left adnexal  region are of uncertain etiology and significance, but can be seen in setting of pelvic congestion syndrome. Clinical correlation is recommended. 4. Additional incidental findings, as above.  No sonographic evidence for ovarian torsion.   Electronically Signed   By: Trudie Reed M.D.   On: 10/21/2012 00:32    MDM  No diagnosis found. Patient with right lower quadrant tenderness. She states that the abdominal tenderness is worsened in the past 2 days. She also feels more nauseated. She denies any fever. Will check basic labs, give pain medicine and nausea medicine. Will order CT abdomen pelvis with contrast. Discussed patient findings with Dr. Ranae Palms, who agrees with the workup plan.  3:42 PM Dr. Marlyne Beards, from surgery will see the patient and offer formal consult.  3:51 PM Patient signed out to Dr. Jeraldine Loots, who will continue care.  Plan: Surgery to see patient and offer advice, recommend admit for serial abdominal exams if surgery doesn't take her.  Roxy Horseman, PA-C 10/22/12 [phone]

## 2012-10-22 NOTE — ED Notes (Signed)
Patient transported to Ultrasound 

## 2012-10-22 NOTE — Telephone Encounter (Signed)
Noted  

## 2012-10-23 ENCOUNTER — Encounter (HOSPITAL_COMMUNITY): Payer: Self-pay | Admitting: Obstetrics & Gynecology

## 2012-10-23 DIAGNOSIS — N83209 Unspecified ovarian cyst, unspecified side: Secondary | ICD-10-CM

## 2012-10-23 HISTORY — DX: Unspecified ovarian cyst, unspecified side: N83.209

## 2012-10-23 LAB — CBC WITH DIFFERENTIAL/PLATELET
Basophils Absolute: 0 10*3/uL (ref 0.0–0.1)
Basophils Relative: 0 % (ref 0–1)
Eosinophils Absolute: 0.2 10*3/uL (ref 0.0–0.7)
Hemoglobin: 10.5 g/dL — ABNORMAL LOW (ref 12.0–15.0)
MCH: 26.1 pg (ref 26.0–34.0)
MCHC: 33.4 g/dL (ref 30.0–36.0)
Neutro Abs: 4.8 10*3/uL (ref 1.7–7.7)
Neutrophils Relative %: 64 % (ref 43–77)
Platelets: 171 10*3/uL (ref 150–400)
RDW: 13.3 % (ref 11.5–15.5)

## 2012-10-23 MED ORDER — ONDANSETRON HCL 4 MG PO TABS
8.0000 mg | ORAL_TABLET | Freq: Three times a day (TID) | ORAL | Status: DC | PRN
  Administered 2012-10-23: 8 mg via ORAL
  Filled 2012-10-23: qty 2

## 2012-10-23 MED ORDER — HYDROMORPHONE HCL 2 MG PO TABS: 1.0000 mg | ORAL_TABLET | ORAL | Status: DC | PRN

## 2012-10-23 MED ORDER — PROMETHAZINE HCL 25 MG PO TABS: 25.0000 mg | ORAL_TABLET | Freq: Four times a day (QID) | ORAL | Status: DC | PRN

## 2012-10-23 MED ORDER — HYDROMORPHONE HCL 2 MG PO TABS
1.0000 mg | ORAL_TABLET | ORAL | Status: DC | PRN
  Administered 2012-10-23: 1 mg via ORAL
  Filled 2012-10-23: qty 1

## 2012-10-23 MED ORDER — ONDANSETRON HCL 8 MG PO TABS: 8.0000 mg | ORAL_TABLET | Freq: Three times a day (TID) | ORAL | Status: DC | PRN

## 2012-10-23 NOTE — Progress Notes (Signed)
Pt out in wheelchair    Teaching complete   Husband with patient

## 2012-10-23 NOTE — H&P (Signed)
Transfer accepted from [facility] ED for acute abdominal/pelvic pain with fluid in pelvis from suspected ruptured ovarian cyst, needing inpatient pain management.   Sydney Hale is an 42 y.o. female.  She is G2P3 with C/section x 2, left salpingectomy and right tubal ligation at Laparoscopy done for off and on LLQ pain (tube was adhesed and twisted) in [place], she is s/p endometrial ablation for menorrhagia about 10 yrs back with regular monthly periods that are lighter since ablation with LMP 1st wk of Sept, regular period.  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 Does not have medical problems or recent acute Gynecologic problems. Has prior Colonoscopy 6 yrs back due to family h/o colon cancer.  She was doing well until sudden onset of RLQ/flank/side pain started on Tues-2 days back when she went to [facility]ED and had normal pelvic sono, was sent home with Percocet that she didn't tolerate well due to itching. She returned to ED today with worsening pain, more in RLQ, severe nausea and pelvic pressure, pain radiating to lower abdomen, some right back and LLQ. Denies bowel complaints including bloody stool, tarry stools, no hematuria or renal stone hx. Denies fever/ chills. Has severe nausea, no vomiting. She was given several doses of Dilaudid after CT was done with minimal relief and due to pelvic fluid noted at CT with presumed diagnosis of ruptured ovarian cyst, she was transferred to our service. Pt sees NP [employer] in [employer] office.   Past Medical History  Diagnosis Date  . PONV (postoperative nausea and vomiting)   . Abdominal pain, RLQ 10/22/2012  . S/P endometrial ablation   . S/P tubal ligation    Past Surgical History  Procedure Laterality Date  . Cesarean section      x2  . Tubal ligation She had the left tube removed at this time, but the ovary was left in place.  9 years ago   Family  History  Problem Relation Age of Onset  . Hyperlipidemia Mother   . Diabetes Mother   . Hyperlipidemia Father   . Cancer Paternal Grandmother    Social History:  reports that she has never smoked. She does not have any smokeless tobacco history on file. She reports that she does not drink alcohol or use illicit drugs.  Allergies: No Known Allergies  Medications:  Prior to Admission:  Prescriptions prior to admission  Medication Sig Dispense Refill  . ibuprofen (ADVIL,MOTRIN) 200 MG tablet Take 600 mg by mouth every 6 (six) hours as needed for pain.       Marland Kitchen oxyCODONE-acetaminophen (PERCOCET/ROXICET) 5-325 MG per tablet Take 1 tablet by mouth every 6 (six) hours as needed for pain.  15 tablet  0   ZOX:[id number] (DILAUDID) injection, promethazine\  Review of Systems  Constitutional: Positive for weight loss. Negative for fever and chills (she has been on a diet, and has lost some weight.).  HENT: Negative.   Eyes: Negative.   Respiratory: Negative.   Cardiovascular: Negative.   Gastrointestinal: Positive for nausea, vomiting and abdominal pain. Negative for heartburn, diarrhea, constipation, blood in stool and melena.  Genitourinary: Negative.   Musculoskeletal:       Some occasional back pain and muscle pain, she carries her 53 y/o CP child as needed.  Skin: Negative.   Endo/Heme/Allergies: Negative.   Psychiatric/Behavioral: Negative.    BP 114/75  Pulse 72  Temp(Src) 98.2 F (36.8 C) (Oral)  Resp 18  Ht 5\' 10"  (1.778 m)  Wt 194 lb 8 oz (88.225 kg)  BMI 27.91 kg/m2  SpO2 98%  LMP 10/06/2012  Physical Exam  Constitutional: She appears well-developed and well-nourished. No distress.  HENT:  Head: Normocephalic and atraumatic.  Nose: Nose normal.  Eyes: Conjunctivae and EOM are normal. Pupils are equal, round, and reactive to light. Right eye exhibits no discharge. Left eye exhibits no discharge. No scleral icterus.  Neck: Normal range of motion. Neck supple. No JVD  present. No tracheal deviation present. No thyromegaly present.  Cardiovascular: Normal rate, regular rhythm, normal heart sounds and intact distal pulses.  Exam reveals no gallop.   No murmur heard. Respiratory: Breath sounds normal. No respiratory distress. She has no wheezes. She has no rales. She exhibits no tenderness.  GI: Soft. Bowel sounds are normal. She exhibits no distension and no mass. There is tenderness (RLQ and pain LLQ going to the right with palpation). There is no rebound and no guarding.  Lymphadenopathy:    She has no cervical adenopathy.  Skin: Skin is warm and dry. No rash noted. She is not diaphoretic. No erythema. No pallor.  Psychiatric: She has a normal mood and affect. Her behavior is normal. Judgment and thought content normal.  Pelvic exam was deferred.   Ct Abdomen Pelvis Wo Contrast 10/20/2012    CLINICAL DATA:  Right-sided flank pain rule out stones.  EXAM: CT ABDOMEN AND PELVIS WITHOUT CONTRAST  TECHNIQUE: Multidetector CT imaging of the abdomen and pelvis was performed following the standard protocol without intravenous contrast.  COMPARISON:  None.  FINDINGS: LOWER CHEST:  Mediastinum: Unremarkable.  Lungs/pleura: No consolidation.  ABDOMEN/PELVIS:  Liver: No focal abnormality.  Biliary: No evidence of biliary obstruction or stone.  Pancreas: Unremarkable.  Spleen: Unremarkable.  Adrenals: Unremarkable.  Kidneys and ureters: No hydronephrosis or stone. Ureters are prominent but symmetric, possibly related to hydration status. No ureteral calculus seen.  Bladder: Unremarkable.  Bowel: No obstruction. Normal appendix.  Retroperitoneum: No mass or adenopathy.  Peritoneum: No free fluid or gas.  Reproductive: Dominant cyst or follicle in the right ovary, 2.3 cm in maximal diameter.  Vascular: No acute abnormality.  OSSEOUS: No acute abnormalities. No suspicious lytic or blastic lesions.  IMPRESSION: Negative for hydronephrosis or urolithiasis.   Electronically Signed   By:  Tiburcio Pea   On: 10/20/2012 21:45   US Transvaginal Non-ob 10/22/2012    CLINICAL DATA:  Right lower quadrant pain. Question torsion.  EXAM: TRANSABDOMINAL AND TRANSVAGINAL ULTRASOUND OF PELVIS  DOPPLER ULTRASOUND OF OVARIES  TECHNIQUE: Both transabdominal and transvaginal ultrasound examinations of the pelvis were performed. Transabdominal technique was performed for global imaging of the pelvis including uterus, ovaries, adnexal regions, and pelvic cul-de-sac.  It was necessary to proceed with endovaginal exam following the transabdominal exam to visualize the ovaries, uterus, endometrium. Color and duplex Doppler ultrasound was utilized to evaluate blood flow to the ovaries.  COMPARISON:  CT earlier today.  FINDINGS: Uterus  Measurements: 9.1 x 5.6 x 5.3 cm. 5 mm cystic area within the myometrium. No fibroids or other mass visualized.  Endometrium  Thickness: 6 mm in thickness. Fluid within the endometrial canal. No focal abnormality visualized.  Right ovary  Measurements: 3.6 x 1.9 x 3.6 cm. Small follicle. Normal arterial and venous blood flow. Normal appearance/no adnexal mass.  Left ovary  Measurements: Not visualized. No adnexal mass.  Pulsed Doppler evaluation of both ovaries demonstrates normal low-resistance  arterial and venous waveforms on the right. Nonvisualization of the left ovary.  Other findings: Moderate complex full free fluid in the pelvis. This could be related to a ruptured ovarian cyst.  IMPRESSION: Normal blood flow to the right ovary. Left ovary not visualized.  Complex free fluid in the pelvis, nonspecific, but would be compatible with ruptured ovarian cyst.  No sonographic evidence for ovarian torsion.   Electronically Signed   By: Charlett Nose M.D.   On: 10/22/2012 14:21   US Transvaginal Non-ob  10/21/2012    CLINICAL DATA:  Right-sided flank pain. Evaluate for ovarian torsion. Right ovarian cyst seen on CT examination.  EXAM: TRANSABDOMINAL AND TRANSVAGINAL ULTRASOUND OF  PELVIS  DOPPLER ULTRASOUND OF OVARIES  TECHNIQUE: Both transabdominal and transvaginal ultrasound examinations of the pelvis were performed. Transabdominal technique was performed for global imaging of the pelvis including uterus, ovaries, adnexal regions, and pelvic cul-de-sac.  It was necessary to proceed with endovaginal exam following the transabdominal exam to visualize the ovaries. Color and duplex Doppler ultrasound was utilized to evaluate blood flow to the ovaries.  COMPARISON:  CT of the abdomen and pelvis 10/20/2012.  FINDINGS: Uterus: Retroverted uterus measuring 7.7 x 5.0 x 5.4 cm. No focal lesions.  Endometrium: 8.9 mm thick. Small amount of fluid in the endometrial canal.  Right ovary: Normal in echotexture and appearance with multiple small follicles. A dominant follicle measuring 2.1 x 1.8 x 1.6 cm incidentally noted.  Left ovary:  Could not be visualized.  Pulsed Doppler evaluation of the right ovary demonstrates normal low-resistance arterial and venous waveforms.  No significant free fluid in the cul-de-sac. Multiple dilated vessels throughout the left adnexa.  IMPRESSION: 1. Limited examination which did not adequately visualize the left ovary. 2. Normal sonographic appearance of the right ovary. Specifically, no evidence of right ovarian torsion. 3. Dilated vessels throughout the left adnexal region are of uncertain etiology and significance, but can be seen in setting of pelvic congestion syndrome. Clinical correlation is recommended. 4. Additional incidental findings, as above.  No sonographic evidence for ovarian torsion.   Electronically Signed   By: Trudie Reed M.D.   On: 10/21/2012 00:32   Ct Abdomen Pelvis W Contrast 10/22/2012    *RADIOLOGY REPORT*  Clinical Data: Right lower quadrant abdominal pain, nausea and vomiting  CT ABDOMEN AND PELVIS WITH CONTRAST  Technique:  Multidetector CT imaging of the abdomen and pelvis was performed following the standard protocol during bolus  administration of intravenous contrast.  Contrast: 50mL OMNIPAQUE IOHEXOL 300 MG/ML  SOLN, OMNIPAQUE IOHEXOL 300 MG/ML  SOLN  Comparison: 10/20/2012  Findings: The lung bases are clear.  Liver, gallbladder, adrenal glands, right kidney, spleen, and pancreas are unremarkable.  3 mm too small to characterize left mid renal cortical hypodensity image 27.  No radiopaque renal, ureteral, or bladder calculus.  Appendix and bowel are normal.  No free air or lymphadenopathy.  Small low density free pelvic fluid is identified.  The uterus and ovaries are normal.  No pelvic lymphadenopathy.  No acute osseous abnormality.  IMPRESSION: No acute intra-abdominal or pelvic pathology.  Small free pelvic fluid which is likely physiologic in this premenopausal patient.  Specifically, the appendix appears normal.   Original Report Authenticated By: Christiana Pellant, M.D.   [place] Art/ven Flow Abd Pelv Doppler 10/22/2012    CLINICAL DATA:  Right lower quadrant pain. Question torsion.  EXAM: TRANSABDOMINAL AND TRANSVAGINAL ULTRASOUND OF PELVIS  DOPPLER ULTRASOUND OF OVARIES  TECHNIQUE: Both transabdominal and transvaginal  ultrasound examinations of the pelvis were performed. Transabdominal technique was performed for global imaging of the pelvis including uterus, ovaries, adnexal regions, and pelvic cul-de-sac.  It was necessary to proceed with endovaginal exam following the transabdominal exam to visualize the ovaries, uterus, endometrium. Color and duplex Doppler ultrasound was utilized to evaluate blood flow to the ovaries.  COMPARISON:  CT earlier today.  FINDINGS: Uterus  Measurements: 9.1 x 5.6 x 5.3 cm. 5 mm cystic area within the myometrium. No fibroids or other mass visualized.  Endometrium  Thickness: 6 mm in thickness. Fluid within the endometrial canal. No focal abnormality visualized.  Right ovary  Measurements: 3.6 x 1.9 x 3.6 cm. Small follicle. Normal arterial and venous blood flow. Normal appearance/no adnexal mass.   Left ovary  Measurements: Not visualized. No adnexal mass.  Pulsed Doppler evaluation of both ovaries demonstrates normal low-resistance arterial and venous waveforms on the right. Nonvisualization of the left ovary.  Other findings: Moderate complex full free fluid in the pelvis. This could be related to a ruptured ovarian cyst.  IMPRESSION: Normal blood flow to the right ovary. Left ovary not visualized.  Complex free fluid in the pelvis, nonspecific, but would be compatible with ruptured ovarian cyst.  No sonographic evidence for ovarian torsion.   Electronically Signed   By: Charlett Nose M.D.   On: 10/22/2012 14:21   [place] Art/ven Flow Abd Pelv Doppler 10/21/2012    CLINICAL DATA:  Right-sided flank pain. Evaluate for ovarian torsion. Right ovarian cyst seen on CT examination.  EXAM: TRANSABDOMINAL AND TRANSVAGINAL ULTRASOUND OF PELVIS  DOPPLER ULTRASOUND OF OVARIES  TECHNIQUE: Both transabdominal and transvaginal ultrasound examinations of the pelvis were performed. Transabdominal technique was performed for global imaging of the pelvis including uterus, ovaries, adnexal regions, and pelvic cul-de-sac.  It was necessary to proceed with endovaginal exam following the transabdominal exam to visualize the ovaries. Color and duplex Doppler ultrasound was utilized to evaluate blood flow to the ovaries.  COMPARISON:  CT of the abdomen and pelvis 10/20/2012.  FINDINGS: Uterus: Retroverted uterus measuring 7.7 x 5.0 x 5.4 cm. No focal lesions.  Endometrium: 8.9 mm thick. Small amount of fluid in the endometrial canal.  Right ovary: Normal in echotexture and appearance with multiple small follicles. A dominant follicle measuring 2.1 x 1.8 x 1.6 cm incidentally noted.  Left ovary:  Could not be visualized.  Pulsed Doppler evaluation of the right ovary demonstrates normal low-resistance arterial and venous waveforms.  No significant free fluid in the cul-de-sac. Multiple dilated vessels throughout the left adnexa.   IMPRESSION: 1. Limited examination which did not adequately visualize the left ovary. 2. Normal sonographic appearance of the right ovary. Specifically, no evidence of right ovarian torsion. 3. Dilated vessels throughout the left adnexal region are of uncertain etiology and significance, but can be seen in setting of pelvic congestion syndrome. Clinical correlation is recommended. 4. Additional incidental findings, as above.  No sonographic evidence for ovarian torsion.   Electronically Signed   By: Trudie Reed M.D.   On: 10/21/2012 00:32    Assessment/Plan: 1. Acute onset right abdominal pain, nausea and some vomiting. 2. Possible right ovarian cyst rupture with peritoneal irritation from fluid causing pain and nausea. Other causes including renal stone, colitis,  Plan- Dilaudid for pain, Zofran, Phenergan, NPO, IV Fluids, reassess in AM, CBCD in am.

## 2012-10-23 NOTE — Progress Notes (Signed)
Subjective: Patient reports pain improved with Dilaudid but not resolved. Denies vomiting. Nausea better with phenergan and may want to try to eat this morning and try PO pain meds. Voiding well. No dizziness.    Objective: I have reviewed patient's vital signs, intake and output, medications and labs. BP 114/75  Pulse 72  Temp(Src) 98.2 F (36.8 C) (Oral)  Resp 18  Ht 5\' 10"  (1.778 m)  Wt 194 lb 8 oz (88.225 kg)  BMI 27.91 kg/m2  SpO2 98%  LMP 10/06/2012  General: alert and cooperative Resp: clear to auscultation bilaterally Cardio: regular rate and rhythm, S1, S2 normal, no murmur, click, rub or gallop GI: soft, non-tender; bowel sounds normal; no masses,  no organomegaly Extremities: extremities normal, atraumatic, no cyanosis or edema  Assessment/Plan: LOS: 1 day  Abdo/pelvic pain from suspected ruptured ovarian cyst due to free fluid noted at CT. Switch to PO Dilaudid and Zofran and Phenergan after general diet tolerated. Ambulate. Labs stable. Plan discharge later if stable and tolerating oral pain meds.   Roshan Salamon R 10/23/2012, 9:10 AM

## 2012-10-23 NOTE — Discharge Summary (Signed)
Physician Discharge Summary  Patient ID: Sydney Hale MRN: [id number] DOB/AGE: 42-Jan-1972 42 y.o.  Admit date: 10/22/2012 Discharge date: 10/23/2012  Admission Diagnoses: Abdominal/pelvic pain, RLQ pain, suspected ruptured ovarian cyst with peritoneal complex fluid  Discharge Diagnoses:  Principal Problem:   Hemorrhagic ovarian cyst Active Problems:   Abdominal pain, RLQ  Discharged Condition: good  Hospital Course: Patient admitted from [facility] ED for pain management overnight due to severe nausea not allowing oral pain control. She stayed stable, after overnight rest, IV fluids and IV antiemetics (Zofran and Phenergan) and IV Dilaudid for pain control, she was able to tolerate some general diet in the morning and take oral pain medications. Vital signs and labs remained stable, patient's exam was non acute and non surgical and hence she was discharged home.   Significant Diagnostic Studies: CT and ultrasound of abdomen and pelvis  Disposition: 01-Home or Self Care  Discharge Orders   Future Orders Complete By Expires   Call MD for:  difficulty breathing, headache or visual disturbances  As directed    Call MD for:  hives  As directed    Call MD for:  persistant dizziness or light-headedness  As directed    Call MD for:  persistant nausea and vomiting  As directed    Call MD for:  severe uncontrolled pain  As directed    Call MD for:  temperature >100.4  As directed    Diet - low sodium heart healthy  As directed    Discharge instructions  As directed    Comments:     Activity as tolerated, pelvic rest. Do not take Ibuprofen for 72 hrs.   Driving Restrictions  As directed    Comments:     Do not drive if you are in pain or are taking narcotic pain medications   Increase activity slowly  As directed    Lifting restrictions  As directed    Comments:     25 lbs only for 48 hrs and then as tolerated   Sexual Activity Restrictions  As directed    Comments:     2 wks        Medication List    STOP taking these medications       ibuprofen 200 MG tablet  Commonly known as:  ADVIL,MOTRIN     oxyCODONE-acetaminophen 5-325 MG per tablet  Commonly known as:  PERCOCET/ROXICET      TAKE these medications       HYDROmorphone 2 MG tablet  Commonly known as:  DILAUDID  Take 0.5 tablets (1 mg total) by mouth every 4 (four) hours as needed.     ondansetron 8 MG tablet  Commonly known as:  ZOFRAN  Take 1 tablet (8 mg total) by mouth every 8 (eight) hours as needed.     promethazine 25 MG tablet  Commonly known as:  PHENERGAN  Take 1 tablet (25 mg total) by mouth every 6 (six) hours as needed for nausea.         Signed: [name] 10/23/2012, 11:07 AM

## 2012-10-23 NOTE — Consult Note (Signed)
Seen, examined, agree with above.  Pt with RLQ tenderness, but 2 negative CTs for appendicitis.  Normal white count, no fevers.    Latest pelvic ultrasound consistent with ruptured cyst.   Recommend gyn consult.

## 2012-10-26 NOTE — ED Provider Notes (Signed)
Medical screening examination/treatment/procedure(s) were conducted as a shared visit with non-physician practitioner(s) and myself.  I personally evaluated the patient during the encounter Pt with worsening RLQ pain, guarding on exam. Repeat CT without acute findings. Signed out pending gen surg consult.   Loren Racer, MD 10/26/12 [phone]

## 2013-05-13 ENCOUNTER — Encounter: Payer: Self-pay | Admitting: Family Medicine

## 2013-05-13 ENCOUNTER — Ambulatory Visit (INDEPENDENT_AMBULATORY_CARE_PROVIDER_SITE_OTHER): Payer: PRIVATE HEALTH INSURANCE | Admitting: Family Medicine

## 2013-05-13 ENCOUNTER — Ambulatory Visit (INDEPENDENT_AMBULATORY_CARE_PROVIDER_SITE_OTHER)
Admission: RE | Admit: 2013-05-13 | Discharge: 2013-05-13 | Disposition: A | Discharge status: Home / Self Care | Payer: PRIVATE HEALTH INSURANCE | Source: Ambulatory Visit | Attending: Family Medicine | Admitting: Family Medicine

## 2013-05-13 VITALS — BP 120/80 | HR 73 | Temp 98.4°F | Wt 190.0 lb

## 2013-05-13 DIAGNOSIS — R0789 Other chest pain: Secondary | ICD-10-CM

## 2013-05-13 DIAGNOSIS — R071 Chest pain on breathing: Secondary | ICD-10-CM

## 2013-05-13 NOTE — Patient Instructions (Signed)
Try over the counter Aleve or Motrin for chest wall pain Follow up for any rash, fever, or worsening or persistent pain.

## 2013-05-13 NOTE — Progress Notes (Signed)
Pre visit review using our clinic review tool, if applicable. No additional management support is needed unless otherwise documented below in the visit note. 

## 2013-05-13 NOTE — Progress Notes (Signed)
   Subjective:    Patient ID: Sydney Hale, female    DOB: 1970-06-09, 43 y.o.   MRN: [id number]  HPI Patient seen for acute visit. She has 2 week history of pain under her left axillary region chest wall. She describes somewhat of a achy to burning type pain. No skin rash. Pain is minimal.  She's been able to run without any difficulty. Denies any pleuritic pain. No recent fever or chills. No recent cough. She has not taken any medications  Past Medical History  Diagnosis Date  . PONV (postoperative nausea and vomiting)   . Abdominal pain, RLQ 10/22/2012  . S/P endometrial ablation   . S/P tubal ligation   . Hemorrhagic ovarian cyst 10/23/2012   Past Surgical History  Procedure Laterality Date  . Cesarean section      x2  . Tubal ligation      reports that she has never smoked. She does not have any smokeless tobacco history on file. She reports that she does not drink alcohol or use illicit drugs. family history includes Cancer in her paternal grandmother; Diabetes in her mother; Hyperlipidemia in her father and mother. No Known Allergies    Review of Systems  Constitutional: Negative for fever, chills, appetite change and unexpected weight change.  Respiratory: Negative for cough and shortness of breath.   Cardiovascular: Negative for chest pain.  Hematological: Negative for adenopathy.       Objective:   Physical Exam  Constitutional: She appears well-developed and well-nourished.  Cardiovascular: Normal rate and regular rhythm.   Pulmonary/Chest: Effort normal and breath sounds normal. No respiratory distress. She has no wheezes. She has no rales.  Left chest wall reveals some tenderness on the anterior axillary space midaxillary space around the fifth or sixth rib. No distinct masses palpated. No skin rashes. No axillary adenopathy.  Skin: No rash noted.          Assessment & Plan:  Left chest wall pain. Suspect musculoskeletal. Obtain left anterior rib films.  Try over-the-counter Motrin or Aleve. Followup promptly for any rashes, worsening pain, or persistent pain

## 2013-08-28 IMAGING — CR DG FOOT COMPLETE 3+V*L*
3 series · 3 of 3 positions shown · non-contrast
Comparison: Foot radiographs 09/26/2009 are labeled left.

CLINICAL DATA: Foot and ankle pain following twisting injury
yesterday.

LEFT FOOT - COMPLETE 3+ VIEW

[x foot ap left]
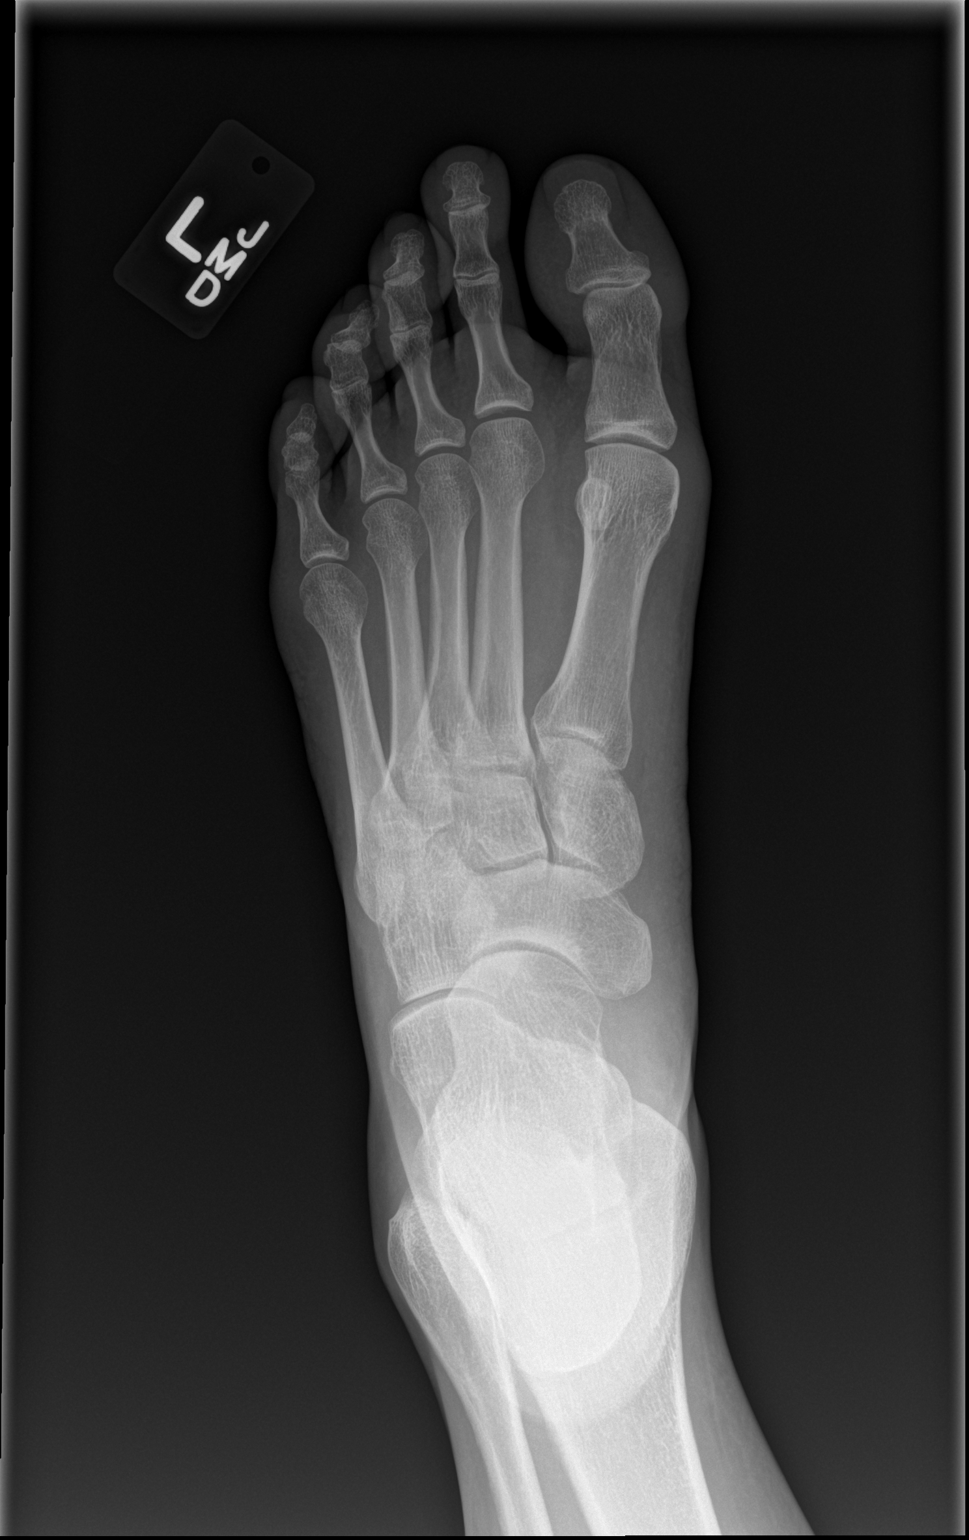

[x foot obl left]
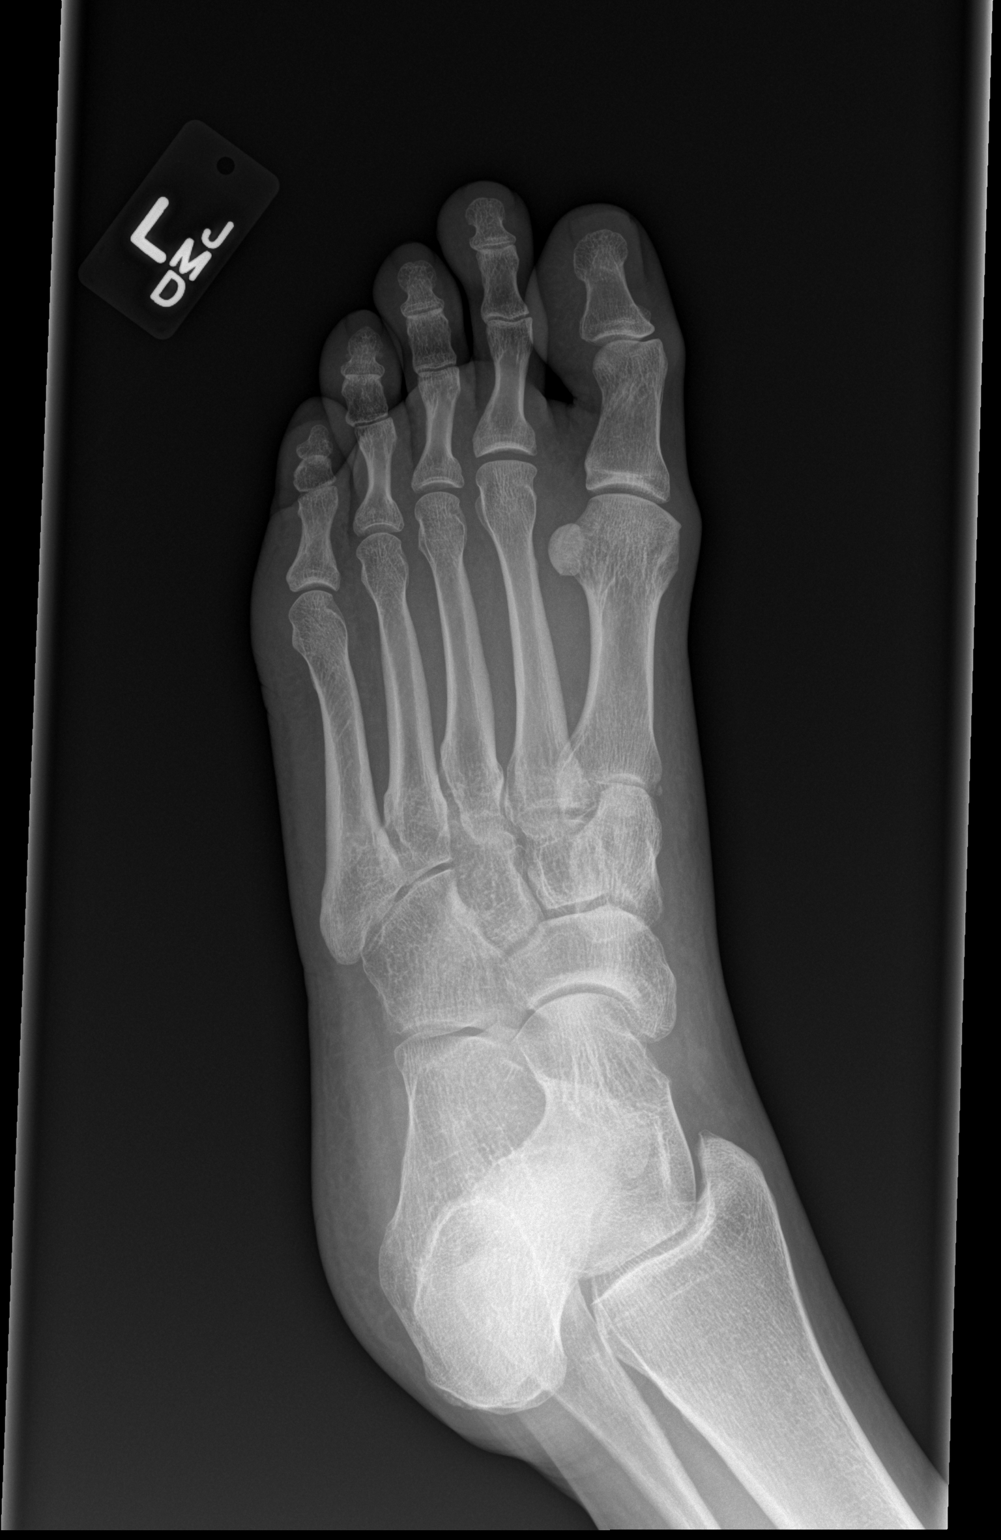

[x foot lat left]
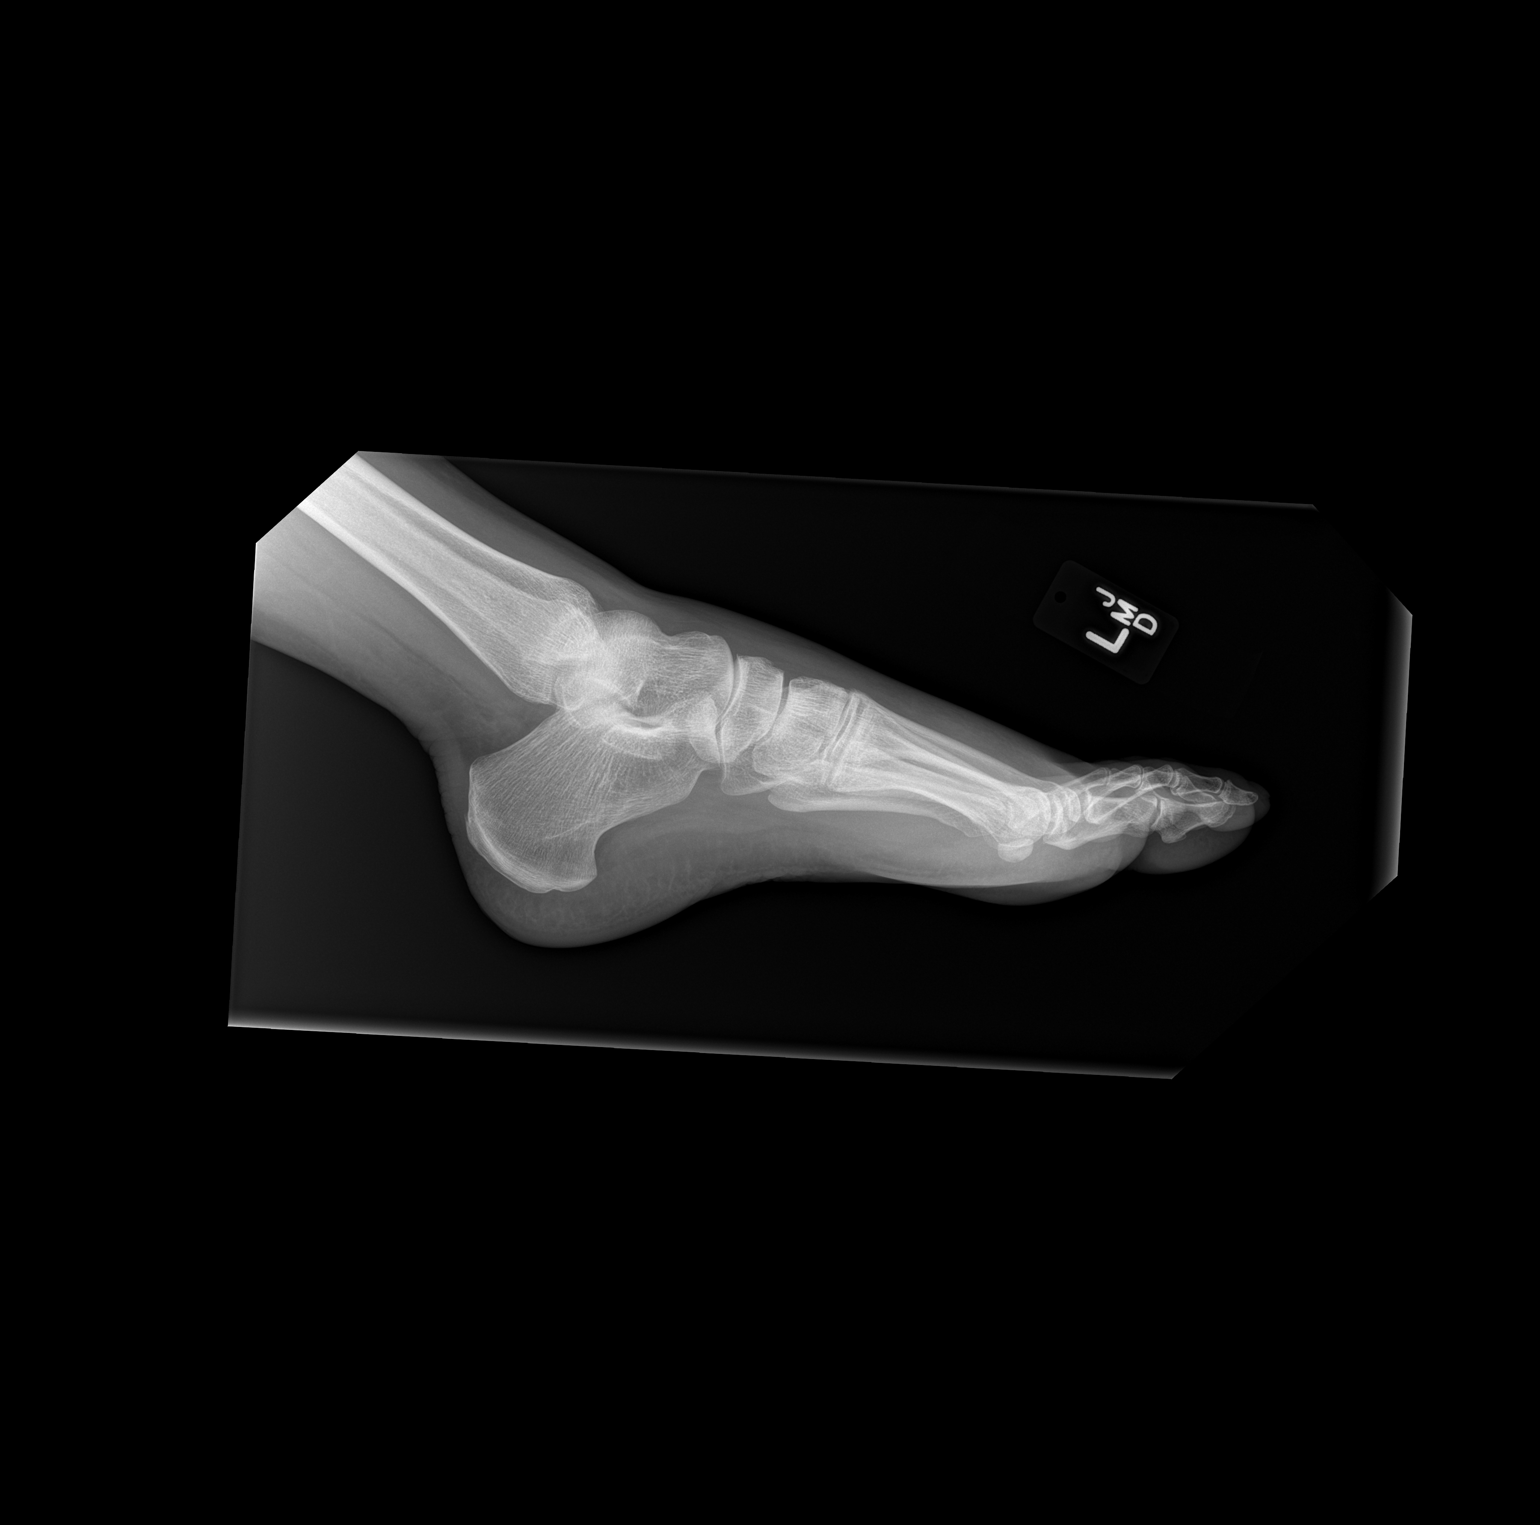

[3 of 3 positions shown; findings below may reference images not displayed]

FINDINGS: The mineralization and alignment are normal.  There is no
evidence of acute fracture or dislocation.  The joint spaces are
maintained.  No focal soft tissue swelling is identified.
IMPRESSION: No acute osseous findings.

## 2013-08-28 IMAGING — CR DG ANKLE COMPLETE 3+V*L*
3 series · 3 of 3 positions shown · non-contrast
Comparison: Foot radiographs 09/26/2009 are labeled left

CLINICAL DATA: Foot and ankle pain following twisting injury
yesterday.

LEFT ANKLE COMPLETE - 3+ VIEW

[x ankle lat left (1 of 3)]
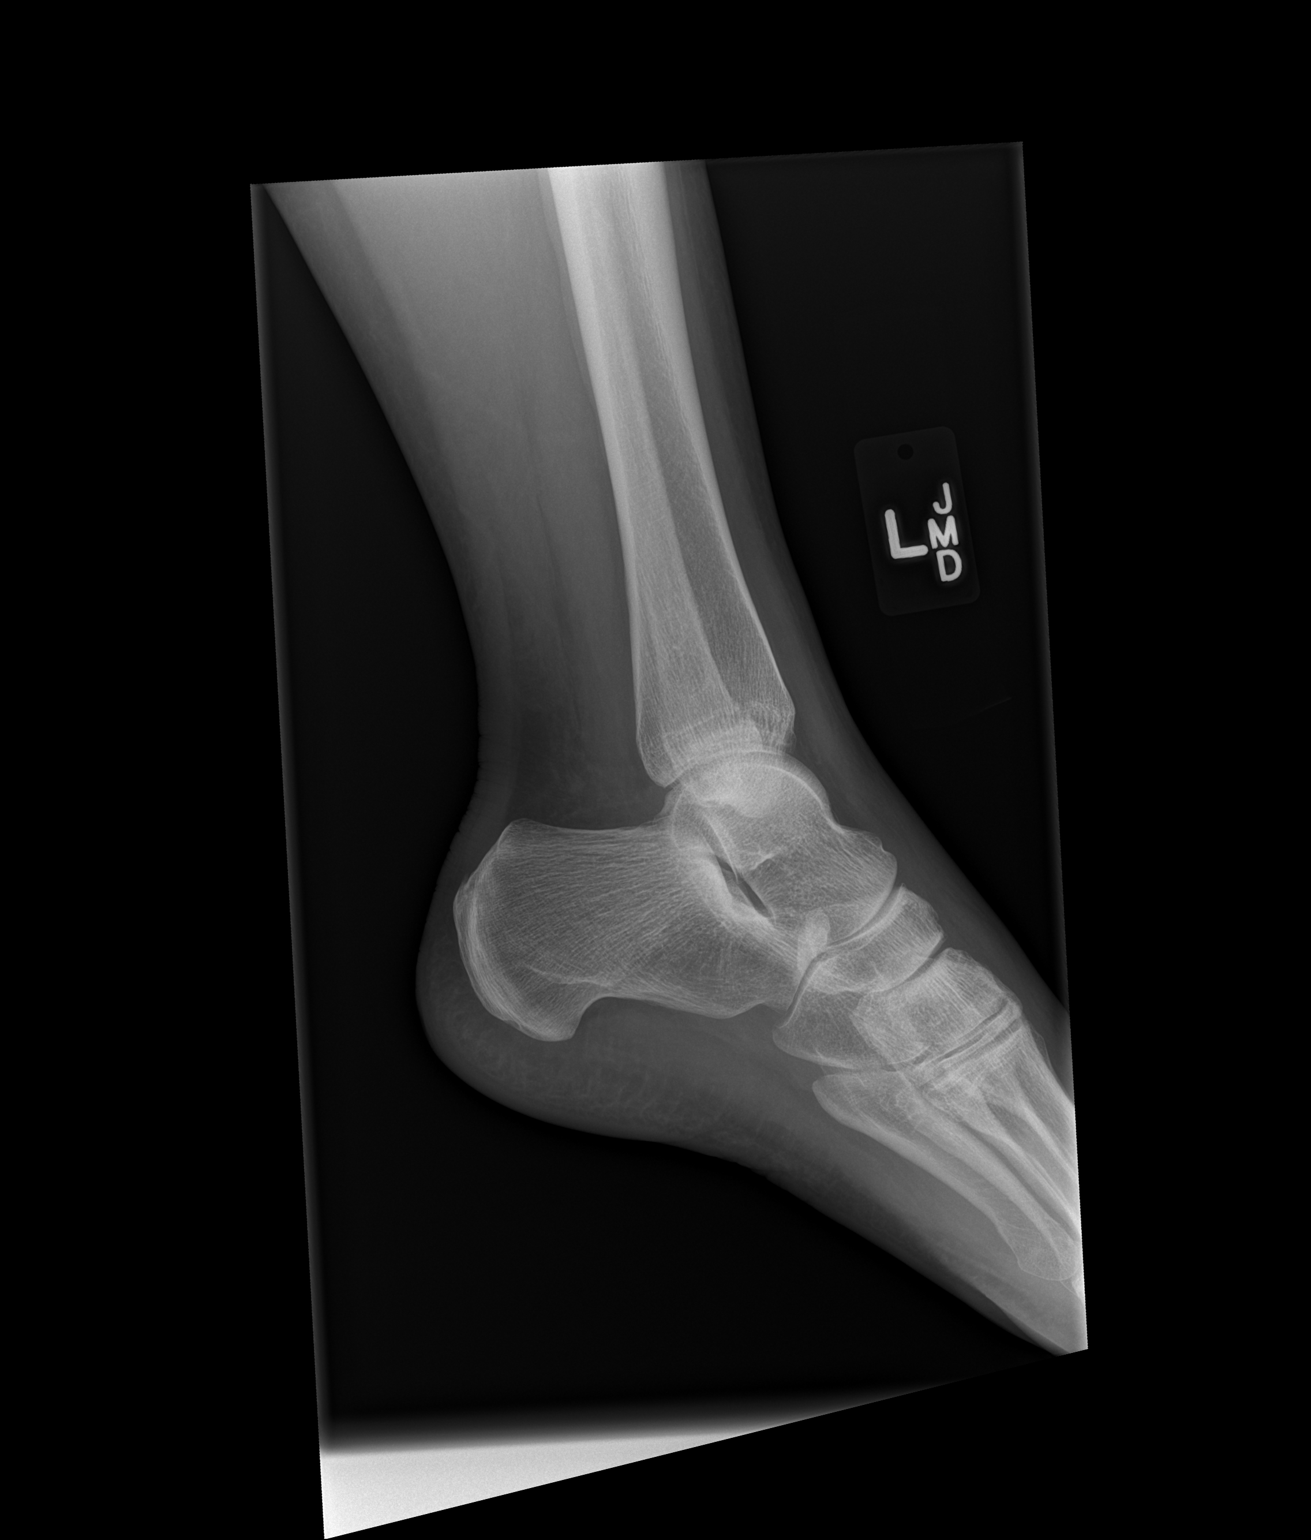

[x ankle lat left (2 of 3)]
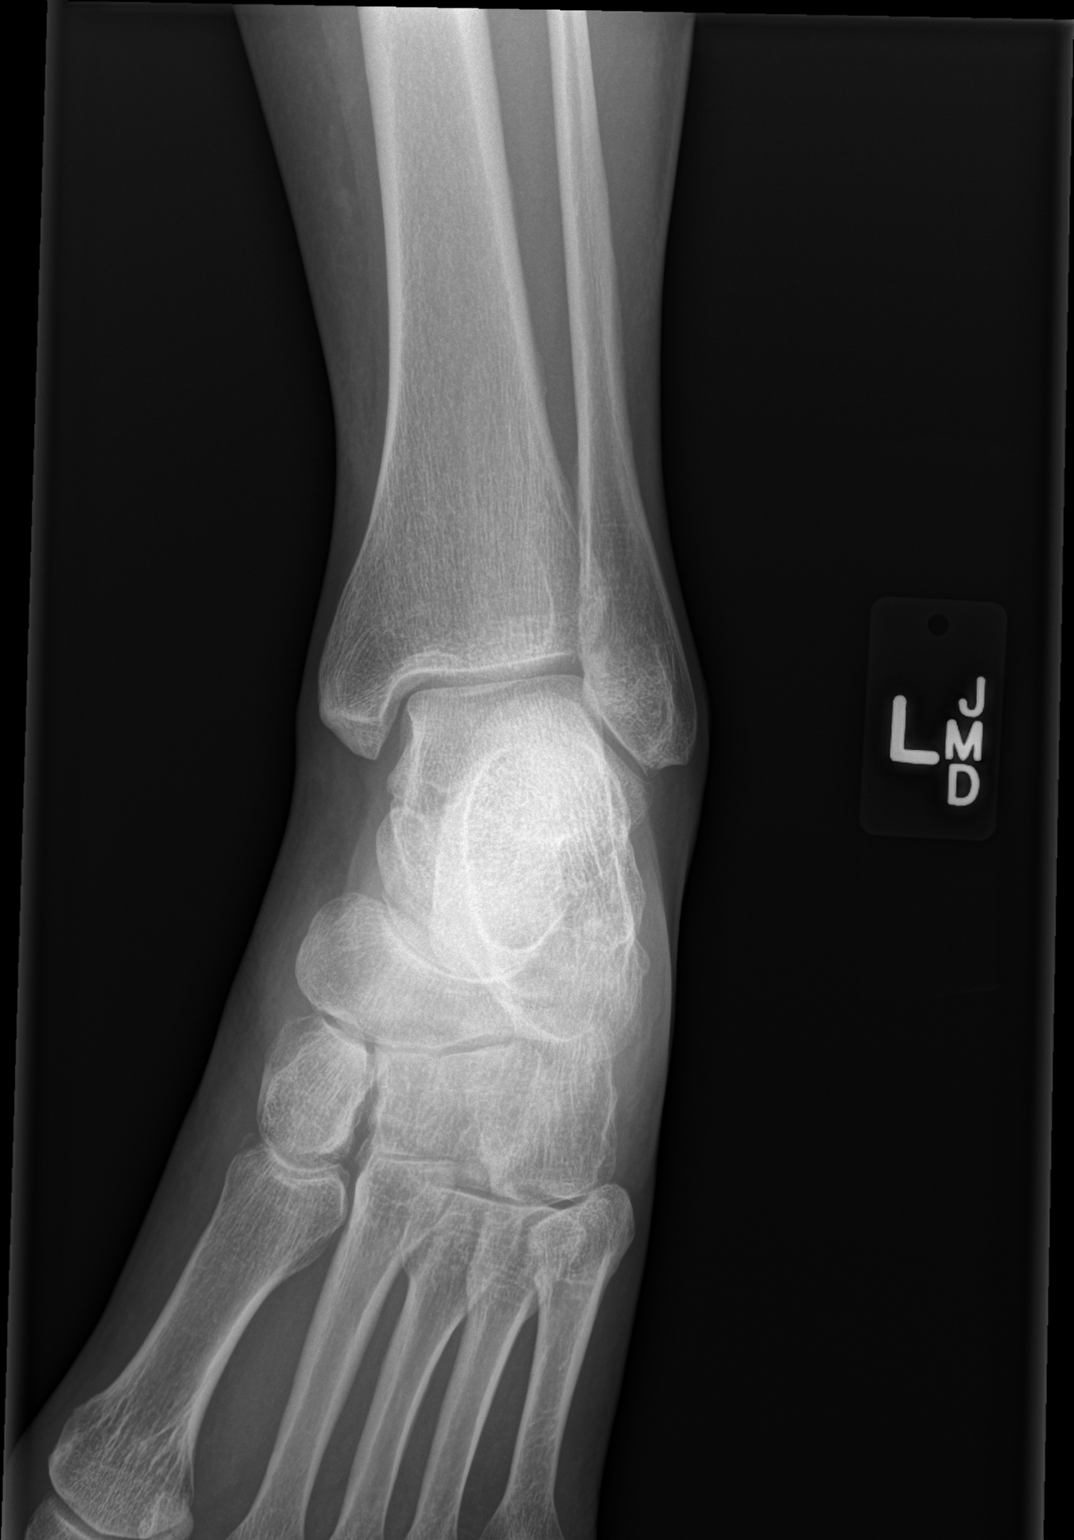

[x ankle lat left (3 of 3)]
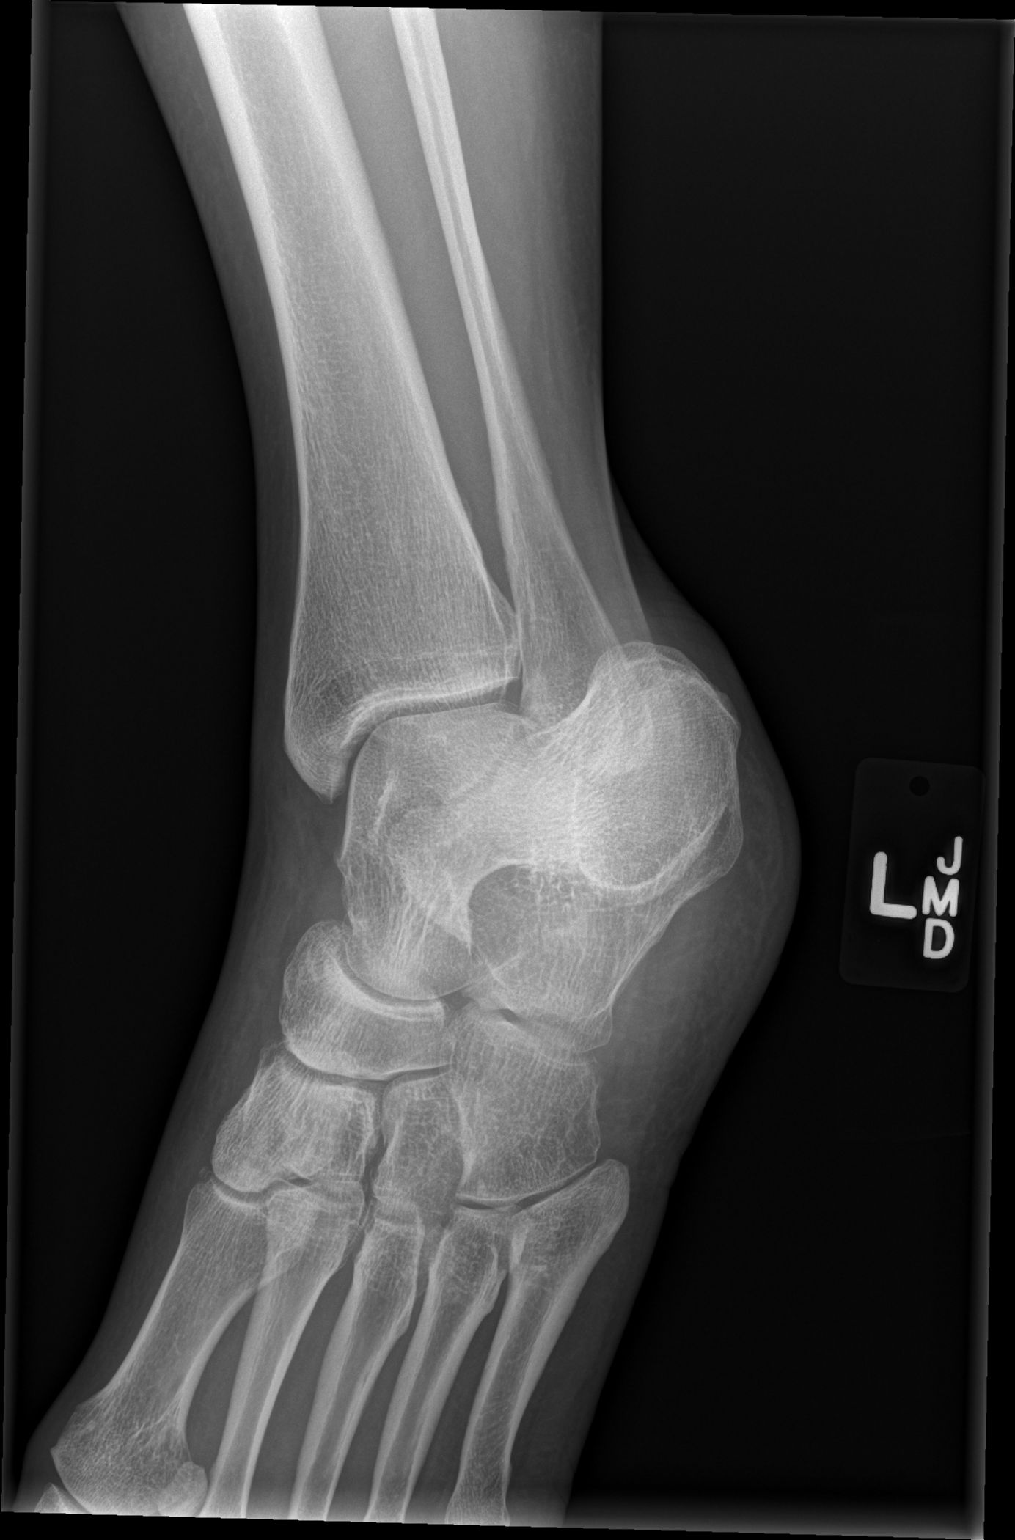

[3 of 3 positions shown; findings below may reference images not displayed]

FINDINGS: The mineralization and alignment are normal.  There is no
evidence of acute fracture or dislocation.  The joint spaces are
maintained.  No focal soft tissue swelling is evident.
IMPRESSION: No acute osseous findings.

## 2013-10-28 ENCOUNTER — Telehealth: Payer: Self-pay | Admitting: *Deleted

## 2013-10-28 ENCOUNTER — Ambulatory Visit (INDEPENDENT_AMBULATORY_CARE_PROVIDER_SITE_OTHER): Payer: PRIVATE HEALTH INSURANCE | Admitting: Internal Medicine

## 2013-10-28 ENCOUNTER — Encounter: Payer: Self-pay | Admitting: Internal Medicine

## 2013-10-28 ENCOUNTER — Ambulatory Visit (INDEPENDENT_AMBULATORY_CARE_PROVIDER_SITE_OTHER)
Admission: RE | Admit: 2013-10-28 | Discharge: 2013-10-28 | Disposition: A | Discharge status: Home / Self Care | Payer: PRIVATE HEALTH INSURANCE | Source: Ambulatory Visit | Attending: Internal Medicine | Admitting: Internal Medicine

## 2013-10-28 VITALS — BP 120/80 | HR 120 | Temp 103.0°F | Resp 20 | Ht 70.0 in | Wt 202.0 lb

## 2013-10-28 DIAGNOSIS — R319 Hematuria, unspecified: Secondary | ICD-10-CM

## 2013-10-28 DIAGNOSIS — R509 Fever, unspecified: Secondary | ICD-10-CM

## 2013-10-28 LAB — COMPREHENSIVE METABOLIC PANEL
ALK PHOS: 52 U/L (ref 39–117)
ALT: 34 U/L (ref 0–35)
AST: 33 U/L (ref 0–37)
Albumin: 3.8 g/dL (ref 3.5–5.2)
BILIRUBIN TOTAL: 0.7 mg/dL (ref 0.2–1.2)
BUN: 9 mg/dL (ref 6–23)
CO2: 21 mEq/L (ref 19–32)
Calcium: 8.9 mg/dL (ref 8.4–10.5)
Chloride: 103 mEq/L (ref 96–112)
Creatinine, Ser: 0.9 mg/dL (ref 0.4–1.2)
GFR: 77.4 mL/min (ref >60.00)
GLUCOSE: 136 mg/dL — AB (ref 70–99)
Potassium: 3.6 mEq/L (ref 3.5–5.1)
SODIUM: 131 meq/L — AB (ref 135–145)
TOTAL PROTEIN: 7.4 g/dL (ref 6.0–8.3)

## 2013-10-28 LAB — CBC WITH DIFFERENTIAL/PLATELET
BASOS ABS: 0 10*3/uL (ref 0.0–0.1)
Basophils Relative: 0.2 % (ref 0.0–3.0)
EOS PCT: 0 % (ref 0.0–5.0)
Eosinophils Absolute: 0 10*3/uL (ref 0.0–0.7)
HEMATOCRIT: 37.3 % (ref 36.0–46.0)
Hemoglobin: 12.4 g/dL (ref 12.0–15.0)
LYMPHS ABS: 0.8 10*3/uL (ref 0.7–4.0)
Lymphocytes Relative: 12.5 % (ref 12.0–46.0)
MCHC: 33.2 g/dL (ref 30.0–36.0)
MCV: 78.4 fl (ref 78.0–100.0)
MONO ABS: 0.4 10*3/uL (ref 0.1–1.0)
Monocytes Relative: 6.5 % (ref 3.0–12.0)
Neutro Abs: 5.1 10*3/uL (ref 1.4–7.7)
Neutrophils Relative %: 80.8 % — ABNORMAL HIGH (ref 43.0–77.0)
PLATELETS: 197 10*3/uL (ref 150.0–400.0)
RBC: 4.76 Mil/uL (ref 3.87–5.11)
RDW: 14.1 % (ref 11.5–15.5)
WBC: 6.4 10*3/uL (ref 4.0–10.5)

## 2013-10-28 LAB — POCT URINALYSIS DIPSTICK
BILIRUBIN UA: NEGATIVE
GLUCOSE UA: NEGATIVE
Ketones, UA: NEGATIVE
NITRITE UA: POSITIVE
Spec Grav, UA: 1.015
Urobilinogen, UA: 1
pH, UA: 7

## 2013-10-28 MED ORDER — CIPROFLOXACIN HCL 500 MG PO TABS: 500.0000 mg | ORAL_TABLET | Freq: Two times a day (BID) | ORAL | Status: DC

## 2013-10-28 MED ORDER — HYDROCODONE-ACETAMINOPHEN 5-325 MG PO TABS: 1.0000 | ORAL_TABLET | Freq: Four times a day (QID) | ORAL | Status: DC | PRN

## 2013-10-28 NOTE — Progress Notes (Signed)
Subjective:    Patient ID: Sydney Hale, female    DOB: 23-May-1970, 43 y.o.   MRN: [id number]  HPI 43 year old patient who is seen today with a chief complaint of fever Patient was well until one week ago, when she developed the fever, mild chills that were initially nocturnal only.  Denies any cold symptoms.  Over the past 3 days.  Temperature has become much more elevated and has been as high as 104 point 5.  Associated symptoms include chills, headache, and generalized myalgias.  She complains of anorexia, but no vomiting.  No diarrhea. She has no pertinent travel history, but her daughter was discharged from the hospital yesterday after approximately 10 days.  The patient has spent most of her waking hours at the hospital due to her daughter's illness Additional symptoms include minor scratchy, sore throat and nonproductive cough.  No sputum production.  She does have some mild chest discomfort with deep inspiration. No history of rash or tick exposure  Past Medical History  Diagnosis Date  . PONV (postoperative nausea and vomiting)   . Abdominal pain, RLQ 10/22/2012  . S/P endometrial ablation   . S/P tubal ligation   . Hemorrhagic ovarian cyst 10/23/2012    History   Social History  . Marital Status: Married    Spouse Name: N/A    Number of Children: N/A  . Years of Education: N/A   Occupational History  . Not on file.   Social History Main Topics  . Smoking status: Never Smoker   . Smokeless tobacco: Not on file  . Alcohol Use: No  . Drug Use: No  . Sexual Activity: Yes    Birth Control/ Protection: None   Other Topics Concern  . Not on file   Social History Narrative  . No narrative on file    Past Surgical History  Procedure Laterality Date  . Cesarean section      x2  . Tubal ligation      Family History  Problem Relation Age of Onset  . Hyperlipidemia Mother   . Diabetes Mother   . Hyperlipidemia Father   . Cancer Paternal Grandmother      Allergies  Allergen Reactions  . Erythromycin Other (See Comments)    Unknown, was told by mother    No current outpatient prescriptions on file prior to visit.   No current facility-administered medications on file prior to visit.    BP 120/80  Pulse 120  Temp(Src) 103 F (39.4 C) (Oral)  Resp 20  Ht 5\' 10"  (1.778 m)  Wt 202 lb (91.627 kg)  BMI 28.98 kg/m2  SpO2 98%      Review of Systems  Constitutional: Positive for fever, chills, activity change, appetite change and fatigue.  HENT: Positive for sore throat. Negative for congestion, dental problem, hearing loss, rhinorrhea, sinus pressure and tinnitus.   Eyes: Negative for pain, discharge and visual disturbance.  Respiratory: Positive for cough. Negative for shortness of breath.   Cardiovascular: Negative for chest pain, palpitations and leg swelling.  Gastrointestinal: Negative for nausea, vomiting, abdominal pain, diarrhea, constipation, blood in stool and abdominal distention.  Genitourinary: Negative for dysuria, urgency, frequency, hematuria, flank pain, vaginal bleeding, vaginal discharge, difficulty urinating, vaginal pain and pelvic pain.  Musculoskeletal: Positive for myalgias. Negative for arthralgias, gait problem, joint swelling, neck pain and neck stiffness.  Skin: Negative for rash.  Neurological: Positive for weakness and headaches. Negative for dizziness, syncope, speech difficulty and numbness.  Hematological: Negative  for adenopathy.  Psychiatric/Behavioral: Negative for behavioral problems, dysphoric mood and agitation. The patient is not nervous/anxious.        Objective:   Physical Exam  Constitutional: She is oriented to person, place, and time. She appears well-developed and well-nourished. No distress.  Appears ill, but in no acute distress Temperature 101 Pulse rate 110  HENT:  Head: Normocephalic.  Right Ear: External ear normal.  Left Ear: External ear normal.  Mouth/Throat:  Oropharynx is clear and moist.  Eyes: Conjunctivae and EOM are normal. Pupils are equal, round, and reactive to light.  Neck: Normal range of motion. Neck supple. No thyromegaly present.  No meningismus No adenopathy  Cardiovascular: Normal rate, regular rhythm, normal heart sounds and intact distal pulses.   Pulmonary/Chest: Effort normal and breath sounds normal. No respiratory distress. She has no wheezes. She has no rales.  Abdominal: Soft. Bowel sounds are normal. She exhibits no distension and no mass. There is no tenderness. There is no rebound and no guarding.  No Suprapubic or CVA tenderness  Musculoskeletal: Normal range of motion.  No swollen or painful joints Right knee was chronically slightly swollen, but not tender or erythematous or warm to touch  Lymphadenopathy:    She has no cervical adenopathy.  Neurological: She is alert and oriented to person, place, and time.  Skin: Skin is warm and dry. No rash noted.  No rash  Psychiatric: She has a normal mood and affect. Her behavior is normal.          Assessment & Plan:   Acute febrile illness. We'll check a chest x-ray, lab, including blood cultures We'll attempt to obtain a urinalysis We'll treat fever, more aggressively   Will report any clinical deterioration We'll force fluids Addendum- urinalysis obtained that revealed mild pyuria.  We'll send for C&S and empirically placed on Cipro

## 2013-10-28 NOTE — Telephone Encounter (Signed)
Pt called back, told her urine looks abnormal so far per Dr. Raliegh Ip and antibiotic was sent to pharmacy for you. Pt verbalized understanding.

## 2013-10-28 NOTE — Telephone Encounter (Signed)
Left message to call office. Rx sent to pharmacy by Dr. Raliegh Ip for urine abnormal.

## 2013-10-28 NOTE — Patient Instructions (Signed)

## 2013-10-28 NOTE — Progress Notes (Signed)
Pre visit review using our clinic review tool, if applicable. No additional management support is needed unless otherwise documented below in the visit note. 

## 2013-10-29 ENCOUNTER — Telehealth: Payer: Self-pay | Admitting: Internal Medicine

## 2013-10-29 NOTE — Telephone Encounter (Signed)
Pt states she can not take the vicodin. It made her sick to her stomach, ears ringings. Pt still has a headache and wants to try another med for this issue. Pt still has a fever. Pt also wants to know if labs are back.

## 2013-10-29 NOTE — Telephone Encounter (Signed)
Dr. Burnice Logan spoke to pt.

## 2013-10-31 LAB — URINE CULTURE

## 2013-11-01 ENCOUNTER — Ambulatory Visit (INDEPENDENT_AMBULATORY_CARE_PROVIDER_SITE_OTHER): Payer: PRIVATE HEALTH INSURANCE | Admitting: Internal Medicine

## 2013-11-01 ENCOUNTER — Encounter: Payer: Self-pay | Admitting: Internal Medicine

## 2013-11-01 VITALS — BP 120/76 | HR 94 | Temp 99.1°F | Resp 20 | Ht 70.0 in | Wt 194.0 lb

## 2013-11-01 DIAGNOSIS — R51 Headache: Secondary | ICD-10-CM

## 2013-11-01 DIAGNOSIS — H538 Other visual disturbances: Secondary | ICD-10-CM

## 2013-11-01 NOTE — Progress Notes (Signed)
Subjective:    Patient ID: Sydney Hale, female    DOB: [birth year], 43 y.o.   MRN: [id number]  HPI   43 year old patient who is seen today in followup for an acute febrile illness.  She was seen 4 days ago and also noted to have pyuria.  She was placed on Cipro for possible UTI.  Urine culture confirmed Escherichia coli UTI which was pansensitive. Blood cultures normal.  Laboratory studies reviewed.  White count normal, although slight left shift. She had a very uncomfortable.  We can with headache, facial fullness, generalized achiness, dizziness.  She has had some minor nonproductive cough and episodes of emesis. She was treated symptomatically with Vicodin, which caused nausea and vomiting.  She has been using the Dilaudid , which was available from prior surgery. Fever last night was as high as 102.  Today she generally feels improved and feels that she is through the worst of the illness.  No rash.  Her major concern is possible transmission to a young daughter  Past Medical History  Diagnosis Date  . PONV (postoperative nausea and vomiting)   . Abdominal pain, RLQ 10/22/2012  . S/P endometrial ablation   . S/P tubal ligation   . Hemorrhagic ovarian cyst 10/23/2012    History   Social History  . Marital Status: Married    Spouse Name: N/A    Number of Children: N/A  . Years of Education: N/A   Occupational History  . Not on file.   Social History Main Topics  . Smoking status: Never Smoker   . Smokeless tobacco: Not on file  . Alcohol Use: No  . Drug Use: No  . Sexual Activity: Yes    Birth Control/ Protection: None   Other Topics Concern  . Not on file   Social History Narrative  . No narrative on file    Past Surgical History  Procedure Laterality Date  . Cesarean section      x2  . Tubal ligation      Family History  Problem Relation Age of Onset  . Hyperlipidemia Mother   . Diabetes Mother   . Hyperlipidemia Father   . Cancer Paternal  Grandmother     Allergies  Allergen Reactions  . Erythromycin Other (See Comments)    Unknown, was told by mother    Current Outpatient Prescriptions on File Prior to Visit  Medication Sig Dispense Refill  . ciprofloxacin (CIPRO) 500 MG tablet Take 1 tablet (500 mg total) by mouth 2 (two) times daily.  14 tablet  0  . ibuprofen (ADVIL,MOTRIN) 200 MG tablet Take 600 mg by mouth every 6 (six) hours as needed.       No current facility-administered medications on file prior to visit.    BP 120/76  Pulse 94  Temp(Src) 99.1 F (37.3 C) (Oral)  Resp 20  Ht 5\' 10"  (1.778 m)  Wt 194 lb (87.998 kg)  BMI 27.84 kg/m2  SpO2 98%  LMP 10/11/2013     Review of Systems  Constitutional: Positive for fever, chills, diaphoresis, activity change, appetite change and fatigue.  HENT: Positive for ear pain and sinus pressure. Negative for congestion, dental problem, hearing loss, rhinorrhea, sore throat and tinnitus.   Eyes: Negative for pain, discharge and visual disturbance.  Respiratory: Positive for cough. Negative for shortness of breath.   Cardiovascular: Negative for chest pain, palpitations and leg swelling.  Gastrointestinal: Positive for nausea and vomiting. Negative for abdominal pain, diarrhea, constipation, blood in  stool and abdominal distention.  Genitourinary: Negative for dysuria, urgency, frequency, hematuria, flank pain, vaginal bleeding, vaginal discharge, difficulty urinating, vaginal pain and pelvic pain.  Musculoskeletal: Positive for myalgias and neck pain. Negative for arthralgias, gait problem and joint swelling.  Skin: Negative for rash.  Neurological: Positive for weakness and headaches. Negative for dizziness, syncope, speech difficulty and numbness.  Hematological: Negative for adenopathy.  Psychiatric/Behavioral: Negative for behavioral problems, dysphoric mood and agitation. The patient is not nervous/anxious.        Objective:   Physical Exam    Constitutional: She is oriented to person, place, and time. She appears well-developed and well-nourished.  HENT:  Head: Normocephalic.  Right Ear: External ear normal.  Left Ear: External ear normal.  Mouth/Throat: Oropharynx is clear and moist.  Oral pharynx minimally injected  Eyes: Conjunctivae and EOM are normal. Pupils are equal, round, and reactive to light.  Visual acuity  20/20 ou  Neck: Normal range of motion. Neck supple. No thyromegaly present.  No meningeal signs  Cardiovascular: Normal rate, regular rhythm, normal heart sounds and intact distal pulses.   No tachycardia  Pulmonary/Chest: Effort normal and breath sounds normal. No respiratory distress. She has no wheezes. She has no rales.  Abdominal: Soft. Bowel sounds are normal. She exhibits no mass. There is no tenderness.  Musculoskeletal: Normal range of motion.  Lymphadenopathy:    She has no cervical adenopathy.  Neurological: She is alert and oriented to person, place, and time.  Skin: Skin is warm and dry. No rash noted.  Psychiatric: She has a normal mood and affect. Her behavior is normal.          Assessment & Plan:   Acute febrile illness.  Probable viral syndrome, improving UTI.  Doubt this is the cause of her acute febrile illness.  Will complete antibiotic therapy Hydrocodone intolerance

## 2013-11-01 NOTE — Progress Notes (Signed)
Pre visit review using our clinic review tool, if applicable. No additional management support is needed unless otherwise documented below in the visit note. 

## 2013-11-01 NOTE — Patient Instructions (Signed)
Drink as much fluid as you  can tolerate over the next few days  Call or return to clinic prn if these symptoms worsen or fail to improve as anticipated.  Take 650-100 mg of Tylenol every 6 hours as needed for pain relief or fever.  Avoid taking more than 3000 mg in a 24-hour period (  This may cause liver damage).

## 2013-11-03 LAB — CULTURE, BLOOD (SINGLE): ORGANISM ID, BACTERIA: NO GROWTH

## 2013-11-12 ENCOUNTER — Telehealth: Payer: Self-pay | Admitting: Internal Medicine

## 2013-11-12 NOTE — Telephone Encounter (Signed)
Pt has been scheduled.  °

## 2013-11-12 NOTE — Telephone Encounter (Signed)
Please advise 

## 2013-11-12 NOTE — Telephone Encounter (Signed)
ok 

## 2013-11-12 NOTE — Telephone Encounter (Signed)
Lm on vm for pt to [facility] and [facility] lab

## 2013-11-12 NOTE — Telephone Encounter (Signed)
Pt has a lab order from [facility] office , (dx uveitis), and would like pt to have those done here. Pt will have eye dr to fax those labs here. Is it ok to schedule for Monday? Pt states she has inflammation in her eyes.  Pt had previous labs done 9/24 and they are ok if on the list.

## 2013-11-15 ENCOUNTER — Other Ambulatory Visit (INDEPENDENT_AMBULATORY_CARE_PROVIDER_SITE_OTHER): Payer: PRIVATE HEALTH INSURANCE

## 2013-11-15 ENCOUNTER — Other Ambulatory Visit: Payer: Self-pay | Admitting: Internal Medicine

## 2013-11-15 DIAGNOSIS — H209 Unspecified iridocyclitis: Secondary | ICD-10-CM

## 2013-11-15 LAB — CBC WITH DIFFERENTIAL/PLATELET
BASOS ABS: 0.1 10*3/uL (ref 0.0–0.1)
Basophils Relative: 0.7 % (ref 0.0–3.0)
Eosinophils Absolute: 0.3 10*3/uL (ref 0.0–0.7)
Eosinophils Relative: 3.5 % (ref 0.0–5.0)
HCT: 37.8 % (ref 36.0–46.0)
Hemoglobin: 12.4 g/dL (ref 12.0–15.0)
LYMPHS PCT: 31.3 % (ref 12.0–46.0)
Lymphs Abs: 2.6 10*3/uL (ref 0.7–4.0)
MCHC: 32.7 g/dL (ref 30.0–36.0)
MCV: 78.4 fl (ref 78.0–100.0)
MONOS PCT: 4.7 % (ref 3.0–12.0)
Monocytes Absolute: 0.4 10*3/uL (ref 0.1–1.0)
NEUTROS PCT: 59.8 % (ref 43.0–77.0)
Neutro Abs: 4.9 10*3/uL (ref 1.4–7.7)
PLATELETS: 305 10*3/uL (ref 150.0–400.0)
RBC: 4.82 Mil/uL (ref 3.87–5.11)
RDW: 14.4 % (ref 11.5–15.5)
WBC: 8.2 10*3/uL (ref 4.0–10.5)

## 2013-11-15 LAB — SEDIMENTATION RATE: Sed Rate: 20 mm/hr (ref 0–22)

## 2013-11-16 LAB — FLUORESCENT TREPONEMAL AB(FTA)-IGG-BLD: Fluorescent Treponemal ABS: NONREACTIVE

## 2013-11-16 LAB — B. BURGDORFI ANTIBODIES: B BURGDORFERI AB IGG+ IGM: 0.46 {ISR}

## 2013-11-16 LAB — RPR

## 2013-11-16 LAB — ANA: ANA: NEGATIVE

## 2013-11-16 LAB — ANGIOTENSIN CONVERTING ENZYME: ANGIOTENSIN-CONVERTING ENZYME: 24 U/L (ref 8–52)

## 2013-11-17 LAB — HLA-B27 ANTIGEN

## 2013-11-18 LAB — HLA-B27 ANTIGEN: DNA Result:: NOT DETECTED

## 2013-12-06 ENCOUNTER — Encounter: Payer: Self-pay | Admitting: Internal Medicine

## 2014-07-29 ENCOUNTER — Encounter: Payer: Self-pay | Admitting: Internal Medicine

## 2014-07-29 ENCOUNTER — Telehealth: Payer: Self-pay | Admitting: Internal Medicine

## 2014-07-29 ENCOUNTER — Ambulatory Visit (INDEPENDENT_AMBULATORY_CARE_PROVIDER_SITE_OTHER): Payer: PRIVATE HEALTH INSURANCE | Admitting: Internal Medicine

## 2014-07-29 VITALS — BP 142/90 | HR 70 | Temp 98.4°F | Resp 20 | Ht 70.0 in | Wt 211.0 lb

## 2014-07-29 DIAGNOSIS — J3089 Other allergic rhinitis: Secondary | ICD-10-CM

## 2014-07-29 DIAGNOSIS — H109 Unspecified conjunctivitis: Secondary | ICD-10-CM

## 2014-07-29 MED ORDER — OLOPATADINE HCL 0.1 % OP SOLN: 1.0000 [drp] | Freq: Three times a day (TID) | OPHTHALMIC | Status: DC

## 2014-07-29 MED ORDER — OLOPATADINE HCL 0.6 % NA SOLN: 2.0000 [drp] | Freq: Three times a day (TID) | NASAL | Status: DC

## 2014-07-29 NOTE — Telephone Encounter (Signed)
Dr.K, please see message and clarify medication?

## 2014-07-29 NOTE — Telephone Encounter (Signed)
patanol (olopatadine)  Opthalmic  One drop each eye twice daily

## 2014-07-29 NOTE — Telephone Encounter (Signed)
Called pharmacy and spoke to Sydney Hale, told him Rx was suppose to be for eyes not nose. Sydney Hale said just resend correct Rx and tell pt can bring back as long as she did not open. Rx sent.  Spoke to pt, told her correct Rx was sent to pharmacy. Asked pt if she opened Rx? Pt stated no. Told her okay pharmacy will take it back without at problem. Pt verbalized understanding.

## 2014-07-29 NOTE — Progress Notes (Signed)
Pre visit review using our clinic review tool, if applicable. No additional management support is needed unless otherwise documented below in the visit note. 

## 2014-07-29 NOTE — Progress Notes (Signed)
Subjective:    Patient ID: Sydney Hale, female    DOB: 1970-06-22, 44 y.o.   MRN: [id number]  HPI  44 year old patient who has a history of allergic rhinitis who presents with a two-week history of red itchy eyes.  Symptoms also involve the upper lids and infra orbital areas.  She has been using some topical hydrocortisone cream.  She is also noted a rash in the postauricular area bilaterally. She had a viral illness in the fall and was seen by ophthalmology due to iritis.  She was treated temporarily with prednisone ophthalmic drops at that time  Past Medical History  Diagnosis Date  . PONV (postoperative nausea and vomiting)   . Abdominal pain, RLQ 10/22/2012  . S/P endometrial ablation   . S/P tubal ligation   . Hemorrhagic ovarian cyst 10/23/2012    History   Social History  . Marital Status: Married    Spouse Name: N/A  . Number of Children: N/A  . Years of Education: N/A   Occupational History  . Not on file.   Social History Main Topics  . Smoking status: Never Smoker   . Smokeless tobacco: Not on file  . Alcohol Use: No  . Drug Use: No  . Sexual Activity: Yes    Birth Control/ Protection: None   Other Topics Concern  . Not on file   Social History Narrative    Past Surgical History  Procedure Laterality Date  . Cesarean section      x2  . Tubal ligation      Family History  Problem Relation Age of Onset  . Hyperlipidemia Mother   . Diabetes Mother   . Hyperlipidemia Father   . Cancer Paternal Grandmother     Allergies  Allergen Reactions  . Erythromycin Other (See Comments)    Unknown, was told by mother  . Hydrocodone     Nausea and vomiting    Current Outpatient Prescriptions on File Prior to Visit  Medication Sig Dispense Refill  . acetaminophen (TYLENOL) 500 MG tablet Take 500 mg by mouth every 6 (six) hours as needed.    Marland Kitchen ibuprofen (ADVIL,MOTRIN) 200 MG tablet Take 600 mg by mouth every 6 (six) hours as needed.     No current  facility-administered medications on file prior to visit.    BP 142/90 mmHg  Pulse 70  Temp(Src) 98.4 F (36.9 C) (Oral)  Resp 20  Ht 5\' 10"  (1.778 m)  Wt 211 lb (95.709 kg)  BMI 30.28 kg/m2  SpO2 98%  LMP 07/18/2014     Review of Systems  Constitutional: Negative.   HENT: Negative for congestion, dental problem, hearing loss, rhinorrhea, sinus pressure, sore throat and tinnitus.   Eyes: Positive for redness and itching. Negative for pain, discharge and visual disturbance.  Respiratory: Negative for cough and shortness of breath.   Cardiovascular: Negative for chest pain, palpitations and leg swelling.  Gastrointestinal: Negative for nausea, vomiting, abdominal pain, diarrhea, constipation, blood in stool and abdominal distention.  Genitourinary: Negative for dysuria, urgency, frequency, hematuria, flank pain, vaginal bleeding, vaginal discharge, difficulty urinating, vaginal pain and pelvic pain.  Musculoskeletal: Negative for joint swelling, arthralgias and gait problem.  Skin: Negative for rash.  Neurological: Negative for dizziness, syncope, speech difficulty, weakness, numbness and headaches.  Hematological: Negative for adenopathy.  Psychiatric/Behavioral: Negative for behavioral problems, dysphoric mood and agitation. The patient is not nervous/anxious.        Objective:   Physical Exam  Constitutional: She is  oriented to person, place, and time. She appears well-developed and well-nourished. No distress.  HENT:  Head: Normocephalic.  Right Ear: External ear normal.  Left Ear: External ear normal.  Mouth/Throat: Oropharynx is clear and moist.  Dry flaky skin in both posterior reticular areas  Eyes: Conjunctivae and EOM are normal. Pupils are equal, round, and reactive to light.  Very minimal conjunctival injection  Neck: Normal range of motion. Neck supple. No thyromegaly present.  Cardiovascular: Normal rate, regular rhythm, normal heart sounds and intact distal  pulses.   Pulmonary/Chest: Effort normal and breath sounds normal.  Abdominal: Soft. Bowel sounds are normal. She exhibits no mass. There is no tenderness.  Musculoskeletal: Normal range of motion.  Lymphadenopathy:    She has no cervical adenopathy.  Neurological: She is alert and oriented to person, place, and time.  Skin: Skin is warm and dry. No rash noted.  Psychiatric: She has a normal mood and affect. Her behavior is normal.          Assessment & Plan:   Mild conjunctivitis probably allergic.  Will try oral and topical antihistamines.  She was counseled to not use hydrocortisone cream about the eye Bilateral posterior auricular dermatitis.  Will try hydrocortisone twice daily to this area

## 2014-07-29 NOTE — Telephone Encounter (Addendum)
Pt was seen today and thought she was picking up eye drops. Pt pick up nasal drops(OLOPATADINE). Please verified.[email or web address]

## 2014-07-29 NOTE — Patient Instructions (Signed)
Use eyedrops as directed  Use a daily nonsedating antihistamine such as Claritin or Allegra  Avoid hydrocortisone cream about the eyes

## 2014-08-29 ENCOUNTER — Telehealth: Payer: Self-pay

## 2014-08-29 ENCOUNTER — Other Ambulatory Visit (INDEPENDENT_AMBULATORY_CARE_PROVIDER_SITE_OTHER): Payer: PRIVATE HEALTH INSURANCE

## 2014-08-29 ENCOUNTER — Ambulatory Visit (INDEPENDENT_AMBULATORY_CARE_PROVIDER_SITE_OTHER): Payer: PRIVATE HEALTH INSURANCE | Admitting: *Deleted

## 2014-08-29 DIAGNOSIS — Z0000 Encounter for general adult medical examination without abnormal findings: Secondary | ICD-10-CM | POA: Diagnosis not present

## 2014-08-29 DIAGNOSIS — Z111 Encounter for screening for respiratory tuberculosis: Secondary | ICD-10-CM | POA: Diagnosis not present

## 2014-08-29 DIAGNOSIS — Z1159 Encounter for screening for other viral diseases: Secondary | ICD-10-CM

## 2014-08-29 LAB — CBC WITH DIFFERENTIAL/PLATELET
Basophils Absolute: 0 10*3/uL (ref 0.0–0.1)
Basophils Relative: 0.5 % (ref 0.0–3.0)
EOS ABS: 0.2 10*3/uL (ref 0.0–0.7)
Eosinophils Relative: 3.1 % (ref 0.0–5.0)
HCT: 38 % (ref 36.0–46.0)
HEMOGLOBIN: 12.6 g/dL (ref 12.0–15.0)
LYMPHS PCT: 34.8 % (ref 12.0–46.0)
Lymphs Abs: 2.2 10*3/uL (ref 0.7–4.0)
MCHC: 33.1 g/dL (ref 30.0–36.0)
MCV: 77 fl — AB (ref 78.0–100.0)
MONO ABS: 0.4 10*3/uL (ref 0.1–1.0)
MONOS PCT: 6.1 % (ref 3.0–12.0)
Neutro Abs: 3.6 10*3/uL (ref 1.4–7.7)
Neutrophils Relative %: 55.5 % (ref 43.0–77.0)
Platelets: 265 10*3/uL (ref 150.0–400.0)
RBC: 4.93 Mil/uL (ref 3.87–5.11)
RDW: 14.3 % (ref 11.5–15.5)
WBC: 6.4 10*3/uL (ref 4.0–10.5)

## 2014-08-29 LAB — POCT URINALYSIS DIPSTICK
BILIRUBIN UA: NEGATIVE
GLUCOSE UA: NEGATIVE
KETONES UA: NEGATIVE
Leukocytes, UA: NEGATIVE
Nitrite, UA: NEGATIVE
PH UA: 5
Protein, UA: NEGATIVE
SPEC GRAV UA: 1.01
Urobilinogen, UA: 0.2

## 2014-08-29 LAB — BASIC METABOLIC PANEL
BUN: 10 mg/dL (ref 6–23)
CO2: 23 mEq/L (ref 19–32)
Calcium: 9.5 mg/dL (ref 8.4–10.5)
Chloride: 107 mEq/L (ref 96–112)
Creatinine, Ser: 0.88 mg/dL (ref 0.40–1.20)
GFR: 74.08 mL/min (ref >60.00)
Glucose, Bld: 85 mg/dL (ref 70–99)
POTASSIUM: 3.8 meq/L (ref 3.5–5.1)
Sodium: 139 mEq/L (ref 135–145)

## 2014-08-29 LAB — LIPID PANEL
CHOLESTEROL: 172 mg/dL (ref 0–200)
HDL: 37.1 mg/dL — AB (ref >39.00)
LDL Cholesterol: 118 mg/dL — ABNORMAL HIGH (ref 0–99)
NonHDL: 134.9
TRIGLYCERIDES: 83 mg/dL (ref 0.0–149.0)
Total CHOL/HDL Ratio: 5
VLDL: 16.6 mg/dL (ref 0.0–40.0)

## 2014-08-29 LAB — HEPATIC FUNCTION PANEL
ALT: 18 U/L (ref 0–35)
AST: 16 U/L (ref 0–37)
Albumin: 4.4 g/dL (ref 3.5–5.2)
Alkaline Phosphatase: 33 U/L — ABNORMAL LOW (ref 39–117)
Bilirubin, Direct: 0.1 mg/dL (ref 0.0–0.3)
Total Bilirubin: 0.5 mg/dL (ref 0.2–1.2)
Total Protein: 7 g/dL (ref 6.0–8.3)

## 2014-08-29 LAB — TSH: TSH: 1.2 u[IU]/mL (ref 0.35–4.50)

## 2014-08-29 NOTE — Telephone Encounter (Signed)
error 

## 2014-08-29 NOTE — Addendum Note (Signed)
Addended by: Elmer Picker on: 08/29/2014 12:46 PM   Modules accepted: Orders

## 2014-08-30 LAB — MEASLES/MUMPS/RUBELLA IMMUNITY
Mumps IgG: <5 AU/mL (ref <9.00)
Rubeola IgG: 150 AU/mL — ABNORMAL HIGH (ref <25.00)

## 2014-08-30 LAB — HEPATITIS B SURFACE ANTIBODY,QUALITATIVE: HEP B S AB: POSITIVE — AB

## 2014-08-30 LAB — VARICELLA ZOSTER ANTIBODY, IGG: Varicella IgG: 445.1 Index — ABNORMAL HIGH (ref <135.00)

## 2014-08-30 LAB — HEPATITIS B SURFACE ANTIGEN: Hepatitis B Surface Ag: NEGATIVE

## 2014-08-30 LAB — VARICELLA ZOSTER ANTIBODY, IGM: Varicella Zoster Ab IgM: 0.38 {ISR} (ref <0.91)

## 2014-08-31 LAB — TB SKIN TEST
INDURATION: 0 mm
TB Skin Test: NEGATIVE

## 2014-09-20 ENCOUNTER — Ambulatory Visit (INDEPENDENT_AMBULATORY_CARE_PROVIDER_SITE_OTHER): Payer: PRIVATE HEALTH INSURANCE | Admitting: Internal Medicine

## 2014-09-20 ENCOUNTER — Encounter: Payer: Self-pay | Admitting: Internal Medicine

## 2014-09-20 VITALS — BP 130/90 | HR 71 | Temp 98.3°F | Resp 20 | Ht 70.0 in | Wt 193.0 lb

## 2014-09-20 DIAGNOSIS — Z23 Encounter for immunization: Secondary | ICD-10-CM | POA: Diagnosis not present

## 2014-09-20 DIAGNOSIS — J3089 Other allergic rhinitis: Secondary | ICD-10-CM

## 2014-09-20 DIAGNOSIS — Z0000 Encounter for general adult medical examination without abnormal findings: Secondary | ICD-10-CM | POA: Diagnosis not present

## 2014-09-20 DIAGNOSIS — Z111 Encounter for screening for respiratory tuberculosis: Secondary | ICD-10-CM | POA: Diagnosis not present

## 2014-09-20 NOTE — Patient Instructions (Signed)

## 2014-09-20 NOTE — Progress Notes (Signed)
Pre visit review using our clinic review tool, if applicable. No additional management support is needed unless otherwise documented below in the visit note. 

## 2014-09-20 NOTE — Progress Notes (Signed)
Subjective:    Patient ID: Sydney Hale, female    DOB: 08-11-1970, 44 y.o.   MRN: [id number]  HPI  44 year old patient who is seen today for a preventive health exam.  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  Only complaint is some itchy dry flaky skin about both eyes.  She does have a history of allergic rhinitis.   Alcohol-Tobacco  Smoking Status: never   Allergies (verified):  No Known Drug Allergies   Past History:  Past Medical History:  left lower quadrant pain  Allergic rhinitis  breast biopsy  History of pregnancy associated protein S deficiency  Past Surgical History:  gravida 3, para 4, abortus zero , lost one of her twins C-section x 3  tubal ligation 2005 with left salpingectomy  breast biopsy 2000 Colonoscopy 2010  Family History:  Reviewed history and no changes required.  father age 61, history of colonic polyps, bladder cancer, remote tobacco use, history of cerebral aneurysm, and secondary seizure disorder  mother, age 60, history of type 2 diabetes, dyslipidemia, hypertension, coronary artery disease, status post stent  Two brothers, one history of chronic seizure disorder, status post surgery to correct  One sister with metabolic syndrome  Paternal grandmother with colon cancer  Social History:  Reviewed history and no changes required.  former nurse  3 children two have cerebral palsy one daughter deceased nonsmokerSmoking Status: never  Past Medical History  Diagnosis Date  . PONV (postoperative nausea and vomiting)   . Abdominal pain, RLQ 10/22/2012  . S/P endometrial ablation   . S/P tubal ligation   . Hemorrhagic ovarian cyst 10/23/2012    Social History   Social History  . Marital Status: Married    Spouse Name: N/A  . Number of Children: N/A  . Years of Education: N/A   Occupational History  . Not on file.   Social History Main Topics  . Smoking status: Never Smoker   . Smokeless tobacco: Not on file  .  Alcohol Use: No  . Drug Use: No  . Sexual Activity: Yes    Birth Control/ Protection: None   Other Topics Concern  . Not on file   Social History Narrative    Past Surgical History  Procedure Laterality Date  . Cesarean section      x2  . Tubal ligation      Family History  Problem Relation Age of Onset  . Hyperlipidemia Mother   . Diabetes Mother   . Hyperlipidemia Father   . Cancer Paternal Grandmother     Allergies  Allergen Reactions  . Erythromycin Other (See Comments)    Unknown, was told by mother  . Hydrocodone     Nausea and vomiting    Current Outpatient Prescriptions on File Prior to Visit  Medication Sig Dispense Refill  . acetaminophen (TYLENOL) 500 MG tablet Take 500 mg by mouth every 6 (six) hours as needed.    Marland Kitchen ibuprofen (ADVIL,MOTRIN) 200 MG tablet Take 600 mg by mouth every 6 (six) hours as needed.     No current facility-administered medications on file prior to visit.    BP 130/90 mmHg  Pulse 71  Temp(Src) 98.3 F (36.8 C) (Oral)  Resp 20  Ht 5\' 10"  (1.778 m)  Wt 193 lb (87.544 kg)  BMI 27.69 kg/m2  SpO2 98%  LMP 09/12/2014       Review of Systems  Constitutional: Negative.   HENT: Negative for congestion, dental problem, hearing loss, rhinorrhea, sinus pressure,  sore throat and tinnitus.   Eyes: Negative for pain, discharge and visual disturbance.       Mild  eye redness and flaky skin involving the eyelids  Respiratory: Negative for cough and shortness of breath.   Cardiovascular: Negative for chest pain, palpitations and leg swelling.  Gastrointestinal: Negative for nausea, vomiting, abdominal pain, diarrhea, constipation, blood in stool and abdominal distention.  Genitourinary: Negative for dysuria, urgency, frequency, hematuria, flank pain, vaginal bleeding, vaginal discharge, difficulty urinating, vaginal pain and pelvic pain.  Musculoskeletal: Negative for joint swelling, arthralgias and gait problem.  Skin: Negative for  rash.  Neurological: Negative for dizziness, syncope, speech difficulty, weakness, numbness and headaches.  Hematological: Negative for adenopathy.  Psychiatric/Behavioral: Negative for behavioral problems, dysphoric mood and agitation. The patient is not nervous/anxious.        Objective:   Physical Exam  Constitutional: She is oriented to person, place, and time. She appears well-developed and well-nourished.  HENT:  Head: Normocephalic and atraumatic.  Right Ear: External ear normal.  Left Ear: External ear normal.  Mouth/Throat: Oropharynx is clear and moist.  Eyes: Conjunctivae and EOM are normal.  Upper eyelid slightly erythematous and scaly  Neck: Normal range of motion. Neck supple. No JVD present. No thyromegaly present.  Cardiovascular: Normal rate, regular rhythm, normal heart sounds and intact distal pulses.   No murmur heard. Pulmonary/Chest: Effort normal and breath sounds normal. She has no wheezes. She has no rales.  Abdominal: Soft. Bowel sounds are normal. She exhibits no distension and no mass. There is no tenderness. There is no rebound and no guarding.  Musculoskeletal: Normal range of motion. She exhibits no edema or tenderness.  Neurological: She is alert and oriented to person, place, and time. She has normal reflexes. No cranial nerve deficit. She exhibits normal muscle tone. Coordination normal.  Skin: Skin is warm and dry. No rash noted.  Psychiatric: She has a normal mood and affect. Her behavior is normal.          Assessment & Plan:   Preventive health examination Mild blepharitis.  Patient requests dermatologic referral for whole body skin examination Allergic rhinitis  Forms completed Return here in one or 2 years or as needed

## 2014-09-23 LAB — TB SKIN TEST
Induration: 0 mm
TB Skin Test: NEGATIVE

## 2014-10-03 LAB — HM MAMMOGRAPHY

## 2014-10-11 ENCOUNTER — Other Ambulatory Visit: Payer: Self-pay | Admitting: Radiology

## 2014-10-21 ENCOUNTER — Encounter: Payer: Self-pay | Admitting: Internal Medicine

## 2014-11-01 ENCOUNTER — Encounter: Payer: Self-pay | Admitting: Internal Medicine

## 2014-12-15 NOTE — Telephone Encounter (Signed)
Apolonio Schneiders, will you close this note for me? It keeps telling me there are orders and it will not let me. Thank you!

## 2015-04-17 ENCOUNTER — Telehealth: Payer: Self-pay | Admitting: Internal Medicine

## 2015-04-17 NOTE — Telephone Encounter (Signed)
Yes, that is fine. 

## 2015-04-17 NOTE — Telephone Encounter (Signed)
Pt is having neck pain, 2 weeks now and not better. Pt is requesting to see Dr Raliegh Ip only. First available is Friday 3/24. Can I use a same day Tues or wed?

## 2015-04-17 NOTE — Telephone Encounter (Signed)
Left message for pt to return my call.

## 2015-04-17 NOTE — Telephone Encounter (Signed)
Pt has been resceduled

## 2015-04-26 ENCOUNTER — Encounter: Payer: Self-pay | Admitting: Internal Medicine

## 2015-04-26 ENCOUNTER — Ambulatory Visit (INDEPENDENT_AMBULATORY_CARE_PROVIDER_SITE_OTHER): Payer: Managed Care, Other (non HMO) | Admitting: Internal Medicine

## 2015-04-26 VITALS — BP 130/88 | HR 63 | Temp 98.0°F | Resp 20 | Ht 70.0 in | Wt 205.0 lb

## 2015-04-26 DIAGNOSIS — M542 Cervicalgia: Secondary | ICD-10-CM | POA: Diagnosis not present

## 2015-04-26 NOTE — Patient Instructions (Signed)
Ultrasound of the neck as discussed Please report any new or worsening symptoms

## 2015-04-26 NOTE — Progress Notes (Signed)
Pre visit review using our clinic review tool, if applicable. No additional management support is needed unless otherwise documented below in the visit note. 

## 2015-04-26 NOTE — Progress Notes (Signed)
Subjective:    Patient ID: Sydney Hale, female    DOB: [birth year], 45 y.o.   MRN: [id number]  HPI  45 year old patient who presents with a two-month history of pain involving the left anterior neck area.  Earlier she did have more pain associated with mild sore throat and she was self treated with an antibiotic.  She feels that this has been helpful but she has had persistent pain in the left anterior neck.  She has been taking ibuprofen with unclear benefit.  No sore throat, fever or other constitutional complaints She is an Therapist, sports  Past Medical History  Diagnosis Date  . PONV (postoperative nausea and vomiting)   . Abdominal pain, RLQ 10/22/2012  . S/P endometrial ablation   . S/P tubal ligation   . Hemorrhagic ovarian cyst 10/23/2012    Social History   Social History  . Marital Status: Married    Spouse Name: N/A  . Number of Children: N/A  . Years of Education: N/A   Occupational History  . Not on file.   Social History Main Topics  . Smoking status: Never Smoker   . Smokeless tobacco: Not on file  . Alcohol Use: No  . Drug Use: No  . Sexual Activity: Yes    Birth Control/ Protection: None   Other Topics Concern  . Not on file   Social History Narrative    Past Surgical History  Procedure Laterality Date  . Cesarean section      x2  . Tubal ligation      Family History  Problem Relation Age of Onset  . Hyperlipidemia Mother   . Diabetes Mother   . Hyperlipidemia Father   . Cancer Paternal Grandmother     Allergies  Allergen Reactions  . Erythromycin Other (See Comments)    Unknown, was told by mother  . Hydrocodone     Nausea and vomiting    Current Outpatient Prescriptions on File Prior to Visit  Medication Sig Dispense Refill  . acetaminophen (TYLENOL) 500 MG tablet Take 500 mg by mouth every 6 (six) hours as needed.    Marland Kitchen ibuprofen (ADVIL,MOTRIN) 200 MG tablet Take 600 mg by mouth every 6 (six) hours as needed.     No current  facility-administered medications on file prior to visit.    BP 130/88 mmHg  Pulse 63  Temp(Src) 98 F (36.7 C) (Oral)  Resp 20  Ht 5\' 10"  (1.778 m)  Wt 205 lb (92.987 kg)  BMI 29.41 kg/m2  SpO2 99%     Review of Systems  Constitutional: Negative.   HENT: Positive for sore throat. Negative for congestion, dental problem, hearing loss, rhinorrhea, sinus pressure and tinnitus.   Eyes: Negative for pain, discharge and visual disturbance.  Respiratory: Negative for cough and shortness of breath.   Cardiovascular: Negative for chest pain, palpitations and leg swelling.  Gastrointestinal: Negative for nausea, vomiting, abdominal pain, diarrhea, constipation, blood in stool and abdominal distention.  Genitourinary: Negative for dysuria, urgency, frequency, hematuria, flank pain, vaginal bleeding, vaginal discharge, difficulty urinating, vaginal pain and pelvic pain.  Musculoskeletal: Positive for neck pain. Negative for joint swelling, arthralgias and gait problem.  Skin: Negative for rash.  Neurological: Negative for dizziness, syncope, speech difficulty, weakness, numbness and headaches.  Hematological: Negative for adenopathy.  Psychiatric/Behavioral: Negative for behavioral problems, dysphoric mood and agitation. The patient is not nervous/anxious.        Objective:   Physical Exam  Constitutional: She is oriented to person,  place, and time. She appears well-developed and well-nourished.  HENT:  Head: Normocephalic.  Right Ear: External ear normal.  Left Ear: External ear normal.  Mouth/Throat: Oropharynx is clear and moist.  Eyes: Conjunctivae and EOM are normal. Pupils are equal, round, and reactive to light.  Neck: Normal range of motion. Neck supple. No thyromegaly present.  Mild tenderness high in the left anterior neck area, but no definite adenopathy  Cardiovascular: Normal rate, regular rhythm, normal heart sounds and intact distal pulses.   Pulmonary/Chest: Effort  normal and breath sounds normal.  Abdominal: Soft. Bowel sounds are normal. She exhibits no mass. There is no tenderness.  Musculoskeletal: Normal range of motion.  Lymphadenopathy:    She has no cervical adenopathy.  Neurological: She is alert and oriented to person, place, and time.  Skin: Skin is warm and dry. No rash noted.  Psychiatric: She has a normal mood and affect. Her behavior is normal.          Assessment & Plan:   Left anterior neck pain.  Will screen with  ultrasound of the neck \\Patient  will report any new or worsening symptoms

## 2015-04-28 ENCOUNTER — Other Ambulatory Visit: Payer: Self-pay | Admitting: Internal Medicine

## 2015-04-28 ENCOUNTER — Ambulatory Visit: Payer: PRIVATE HEALTH INSURANCE | Admitting: Internal Medicine

## 2015-04-28 ENCOUNTER — Ambulatory Visit
Admission: RE | Admit: 2015-04-28 | Discharge: 2015-04-28 | Disposition: A | Discharge status: Home / Self Care | Payer: Managed Care, Other (non HMO) | Source: Ambulatory Visit | Attending: Internal Medicine | Admitting: Internal Medicine

## 2015-04-28 DIAGNOSIS — E041 Nontoxic single thyroid nodule: Secondary | ICD-10-CM

## 2015-04-28 DIAGNOSIS — M542 Cervicalgia: Secondary | ICD-10-CM

## 2015-05-02 ENCOUNTER — Telehealth: Payer: Self-pay | Admitting: Internal Medicine

## 2015-05-02 NOTE — Telephone Encounter (Signed)
Opened in error

## 2015-05-09 ENCOUNTER — Other Ambulatory Visit (HOSPITAL_COMMUNITY)
Admission: RE | Admit: 2015-05-09 | Discharge: 2015-05-09 | Disposition: A | Discharge status: Home / Self Care | Payer: Managed Care, Other (non HMO) | Source: Ambulatory Visit | Attending: Radiology | Admitting: Radiology

## 2015-05-09 ENCOUNTER — Ambulatory Visit
Admission: RE | Admit: 2015-05-09 | Discharge: 2015-05-09 | Disposition: A | Discharge status: Home / Self Care | Payer: Managed Care, Other (non HMO) | Source: Ambulatory Visit | Attending: Internal Medicine | Admitting: Internal Medicine

## 2015-05-09 DIAGNOSIS — E041 Nontoxic single thyroid nodule: Secondary | ICD-10-CM | POA: Insufficient documentation

## 2016-02-07 ENCOUNTER — Ambulatory Visit (INDEPENDENT_AMBULATORY_CARE_PROVIDER_SITE_OTHER): Payer: Managed Care, Other (non HMO) | Admitting: Family Medicine

## 2016-02-07 ENCOUNTER — Encounter: Payer: Self-pay | Admitting: Family Medicine

## 2016-02-07 VITALS — BP 118/80 | HR 87 | Temp 98.0°F | Wt 213.4 lb

## 2016-02-07 DIAGNOSIS — J069 Acute upper respiratory infection, unspecified: Secondary | ICD-10-CM

## 2016-02-07 DIAGNOSIS — B9789 Other viral agents as the cause of diseases classified elsewhere: Secondary | ICD-10-CM | POA: Diagnosis not present

## 2016-02-07 MED ORDER — BENZONATATE 200 MG PO CAPS: 200.0000 mg | ORAL_CAPSULE | Freq: Three times a day (TID) | ORAL | 0 refills | Status: DC | PRN

## 2016-02-07 NOTE — Progress Notes (Signed)
Subjective:     Patient ID: Sydney Hale, female   DOB: July 30, 1970, 46 y.o.   MRN: [id number]  HPI Acute visit for cough and congestion. Started about a week ago. Started gradually. Cough is worsening at night. She's tried over-the-counter medications including Advil cold and flu, TheraFlu, and Tussend some cough syrup without much improvement. She has previous intolerance with hydrocodone. No fever. No dyspnea. Minimal body aches. Nonsmoker. No nausea or vomiting.  Past Medical History:  Diagnosis Date  . Abdominal pain, RLQ 10/22/2012  . Hemorrhagic ovarian cyst 10/23/2012  . PONV (postoperative nausea and vomiting)   . S/P endometrial ablation   . S/P tubal ligation    Past Surgical History:  Procedure Laterality Date  . CESAREAN SECTION     x2  . TUBAL LIGATION      reports that she has never smoked. She does not have any smokeless tobacco history on file. She reports that she does not drink alcohol or use drugs. family history includes Cancer in her paternal grandmother; Diabetes in her mother; Hyperlipidemia in her father and mother. Allergies  Allergen Reactions  . Erythromycin Other (See Comments)    Unknown, was told by mother  . Hydrocodone     Nausea and vomiting     Review of Systems  Constitutional: Positive for fatigue. Negative for chills and fever.  HENT: Positive for congestion.   Respiratory: Positive for cough.   Neurological: Positive for headaches.       Objective:   Physical Exam  Constitutional: She appears well-developed and well-nourished.  HENT:  Right Ear: External ear normal.  Left Ear: External ear normal.  Mouth/Throat: Oropharynx is clear and moist.  Neck: Neck supple.  Cardiovascular: Normal rate and regular rhythm.   Pulmonary/Chest: Effort normal and breath sounds normal. No respiratory distress. She has no wheezes. She has no rales.  Lymphadenopathy:    She has no cervical adenopathy.       Assessment:     Probable viral URI  with cough. Nonfocal exam. Doubt influenza    Plan:     -Tessalon Perles 200 mg every 8 hours as needed for cough -Stay well-hydrated -Follow-up promptly for any fever or worsening symptoms  Eulas Post MD [facility] at [facility]

## 2016-02-07 NOTE — Progress Notes (Signed)
Pre visit review using our clinic review tool, if applicable. No additional management support is needed unless otherwise documented below in the visit note. 

## 2016-02-07 NOTE — Patient Instructions (Signed)

## 2016-07-03 ENCOUNTER — Ambulatory Visit (INDEPENDENT_AMBULATORY_CARE_PROVIDER_SITE_OTHER): Payer: Managed Care, Other (non HMO) | Admitting: Family Medicine

## 2016-07-03 ENCOUNTER — Encounter: Payer: Self-pay | Admitting: Family Medicine

## 2016-07-03 VITALS — BP 127/88 | HR 73 | Temp 98.3°F | Ht 70.0 in | Wt 205.0 lb

## 2016-07-03 DIAGNOSIS — R002 Palpitations: Secondary | ICD-10-CM

## 2016-07-03 NOTE — Progress Notes (Signed)
   Subjective:    Patient ID: Sydney Hale, female    DOB: 01-Aug-1970, 46 y.o.   MRN: [id number]  HPI Here for 4 days of intermittent palpitations. She feels episodes of very rapid heartbeats which last only 10-20 seconds at a time. These occur randomly throughout the day and sometimes at night. There is no SOB or chest pain or lightheadedness with them. They are not related to exertion. She says they often lead to an urge to cough, and when she coughs they promptly stop. She normally takes no medications but she started taking one aspirin a day several days ago as a precaution. She drinks one cup of coffee a day and gets no other caffeine throughout the day. She does not use energy drinks. As we speak today she feels fine.    Review of Systems  Constitutional: Negative.   Respiratory: Negative.   Cardiovascular: Positive for palpitations. Negative for chest pain and leg swelling.  Gastrointestinal: Negative.   Neurological: Negative.        Objective:   Physical Exam  Constitutional: She is oriented to person, place, and time. She appears well-developed and well-nourished. No distress.  Neck: No thyromegaly present.  Cardiovascular: Normal rate, regular rhythm, normal heart sounds and intact distal pulses.   EKG shows sinus rhythm with a normal rate   Pulmonary/Chest: Effort normal and breath sounds normal. No respiratory distress. She has no wheezes. She has no rales.  Lymphadenopathy:    She has no cervical adenopathy.  Neurological: She is alert and oriented to person, place, and time.          Assessment & Plan:  Palpitations of uncertain etiology. We will get labs today including a TSH. Set up a 48 hour Holter monitor soon. She will let us know if anything changes.  Alysia Penna, MD

## 2016-07-03 NOTE — Patient Instructions (Signed)
WE NOW OFFER   [facility] FAST TRACK!!!  SAME DAY Appointments for ACUTE CARE  Such as: Sprains, Injuries, cuts, abrasions, rashes, muscle pain, joint pain, back pain Colds, flu, sore throats, headache, allergies, cough, fever  Ear pain, sinus and eye infections Abdominal pain, nausea, vomiting, diarrhea, upset stomach Animal/insect bites  3 Easy Ways to Schedule: Walk-In Scheduling Call in scheduling Mychart Sign-up: [email or web address]         

## 2016-07-04 ENCOUNTER — Ambulatory Visit (INDEPENDENT_AMBULATORY_CARE_PROVIDER_SITE_OTHER): Payer: Managed Care, Other (non HMO)

## 2016-07-04 DIAGNOSIS — R002 Palpitations: Secondary | ICD-10-CM

## 2016-07-04 LAB — CBC WITH DIFFERENTIAL/PLATELET
BASOS ABS: 0.1 10*3/uL (ref 0.0–0.1)
BASOS PCT: 0.6 % (ref 0.0–3.0)
EOS PCT: 2.7 % (ref 0.0–5.0)
Eosinophils Absolute: 0.3 10*3/uL (ref 0.0–0.7)
HEMATOCRIT: 37.8 % (ref 36.0–46.0)
Hemoglobin: 12.5 g/dL (ref 12.0–15.0)
Lymphocytes Relative: 25 % (ref 12.0–46.0)
Lymphs Abs: 2.7 10*3/uL (ref 0.7–4.0)
MCHC: 33.1 g/dL (ref 30.0–36.0)
MCV: 75.5 fl — AB (ref 78.0–100.0)
MONOS PCT: 7.2 % (ref 3.0–12.0)
Monocytes Absolute: 0.8 10*3/uL (ref 0.1–1.0)
NEUTROS ABS: 6.9 10*3/uL (ref 1.4–7.7)
Neutrophils Relative %: 64.5 % (ref 43.0–77.0)
PLATELETS: 295 10*3/uL (ref 150.0–400.0)
RBC: 5.01 Mil/uL (ref 3.87–5.11)
RDW: 14.5 % (ref 11.5–15.5)
WBC: 10.7 10*3/uL — ABNORMAL HIGH (ref 4.0–10.5)

## 2016-07-04 LAB — BASIC METABOLIC PANEL
BUN: 13 mg/dL (ref 6–23)
CHLORIDE: 106 meq/L (ref 96–112)
CO2: 25 meq/L (ref 19–32)
Calcium: 9.6 mg/dL (ref 8.4–10.5)
Creatinine, Ser: 0.83 mg/dL (ref 0.40–1.20)
GFR: 78.6 mL/min (ref >60.00)
GLUCOSE: 128 mg/dL — AB (ref 70–99)
POTASSIUM: 3.7 meq/L (ref 3.5–5.1)
SODIUM: 139 meq/L (ref 135–145)

## 2016-07-04 LAB — TSH: TSH: 1.58 u[IU]/mL (ref 0.35–4.50)

## 2016-07-16 ENCOUNTER — Telehealth: Payer: Self-pay | Admitting: Internal Medicine

## 2016-07-16 NOTE — Telephone Encounter (Signed)
Pt saw Dr Sarajane Jews in Dr Raliegh Ip absence.  Pt wore a halter monitor last week and was told the results would come here.   Pt would like a call back with results.

## 2016-07-17 NOTE — Telephone Encounter (Signed)
The results will come to me but they are still in process. It usually takes about 2 weeks

## 2016-07-17 NOTE — Telephone Encounter (Signed)
I spoke with pt and gave below message.  

## 2016-10-24 ENCOUNTER — Encounter: Payer: Self-pay | Admitting: Internal Medicine

## 2016-10-24 LAB — HM MAMMOGRAPHY

## 2016-11-13 ENCOUNTER — Encounter: Payer: Self-pay | Admitting: Internal Medicine

## 2017-02-04 ENCOUNTER — Emergency Department (HOSPITAL_COMMUNITY)
Admission: EM | Admit: 2017-02-04 | Discharge: 2017-02-04 | Disposition: A | Discharge status: Home / Self Care | Payer: No Typology Code available for payment source | Attending: Emergency Medicine | Admitting: Emergency Medicine

## 2017-02-04 ENCOUNTER — Encounter (HOSPITAL_COMMUNITY): Payer: Self-pay | Admitting: Emergency Medicine

## 2017-02-04 DIAGNOSIS — Y92129 Unspecified place in nursing home as the place of occurrence of the external cause: Secondary | ICD-10-CM | POA: Insufficient documentation

## 2017-02-04 DIAGNOSIS — Y93F2 Activity, caregiving, lifting: Secondary | ICD-10-CM | POA: Insufficient documentation

## 2017-02-04 DIAGNOSIS — X509XXA Other and unspecified overexertion or strenuous movements or postures, initial encounter: Secondary | ICD-10-CM | POA: Insufficient documentation

## 2017-02-04 DIAGNOSIS — T148XXA Other injury of unspecified body region, initial encounter: Secondary | ICD-10-CM | POA: Insufficient documentation

## 2017-02-04 DIAGNOSIS — Y990 Civilian activity done for income or pay: Secondary | ICD-10-CM | POA: Insufficient documentation

## 2017-02-04 LAB — PREGNANCY, URINE: Preg Test, Ur: NEGATIVE

## 2017-02-04 NOTE — ED Provider Notes (Signed)
[facility] DEPT Provider Note   CSN: [id number] Arrival date & time: 02/04/17  0935     History   Chief Complaint Chief Complaint  Patient presents with  . Back Pain    HPI Sydney Hale is a 47 y.o. female.  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  She was able to ambulate afterwards but has persistent pain right upper back, with movement, standing and walking.  She denies headache, neck pain or inability to walk.  There are no other known modifying factors.  HPI  Past Medical History:  Diagnosis Date  . Abdominal pain, RLQ 10/22/2012  . Hemorrhagic ovarian cyst 10/23/2012  . PONV (postoperative nausea and vomiting)   . S/P endometrial ablation   . S/P tubal ligation     Patient Active Problem List   Diagnosis Date Noted  . Hemorrhagic ovarian cyst 10/23/2012  . Abdominal pain, RLQ 10/22/2012  . Allergic rhinitis 11/21/2009  . ABDOMINAL PAIN, LEFT LOWER QUADRANT, HX OF 11/21/2009    Past Surgical History:  Procedure Laterality Date  . CESAREAN SECTION     x2  . TUBAL LIGATION      OB History    Gravida Para Term Preterm AB Living   3 3 1 2   4    SAB TAB Ectopic Multiple Live Births                   Home Medications    Prior to Admission medications   Medication Sig Start Date End Date Taking? Authorizing Provider  acetaminophen (TYLENOL) 500 MG tablet Take 500 mg by mouth every 6 (six) hours as needed.   Yes [provider]  ibuprofen (ADVIL,MOTRIN) 200 MG tablet Take 600 mg by mouth every 6 (six) hours as needed.   Yes [provider]  Multiple Vitamin  (MULTIVITAMIN WITH MINERALS) TABS tablet Take 1 tablet by mouth daily.   Yes [provider]  benzonatate (TESSALON) 200 MG capsule Take 1 capsule (200 mg total) by mouth 3 (three) times daily as needed. Patient not taking: Reported on 07/03/2016 02/07/16   Eulas Post, MD    Family History Family History  Problem Relation Age of Onset  . Hyperlipidemia Mother   . Diabetes Mother   . Hyperlipidemia Father   . Cancer Paternal Grandmother     Social History Social History   Tobacco Use  . Smoking status: Never Smoker  . Smokeless tobacco: Never Used  Substance Use Topics  . Alcohol use: No  . Drug use: No     Allergies   Erythromycin and Hydrocodone   Review of Systems Review of Systems  All other systems reviewed and are negative.    Physical Exam Updated Vital Signs BP 139/87 (BP Location: Right Arm)   Pulse 73   Temp 98.2 F (36.8 C) (Oral)   Resp 15   Ht 5\' 10"  (1.778 m)   Wt 95.3 kg (210 lb)   SpO2 100%   BMI 30.13 kg/m   Physical Exam  Constitutional: She is oriented to person, place, and time. She appears well-developed and well-nourished.  She appears distressed (Uncomfortable).  HENT:  Head: Normocephalic and atraumatic.  Eyes: Conjunctivae and EOM are normal. Pupils are equal, round, and reactive to light.  Neck: Normal range of motion and phonation normal. Neck supple.  Cardiovascular: Normal rate.  Pulmonary/Chest: Effort normal.  Musculoskeletal:  Moderate tenderness right upper posterior thorax, with guarding and resisting motion in this region.  Normal range of motion of arms and legs bilaterally.  Normal range of motion neck.  Neurological: She is alert and oriented to person, place, and time. She exhibits normal muscle tone.  Skin: Skin is warm and dry.  Psychiatric: She has a normal mood and affect. Her behavior is normal. Judgment and thought content normal.  Nursing note and vitals reviewed.    ED Treatments / Results    Labs (all labs ordered are listed, but only abnormal results are displayed) Labs Reviewed  PREGNANCY, URINE    EKG  EKG Interpretation None       Radiology No results found.  Procedures Procedures (including critical care time)  Medications Ordered in ED Medications - No data to display   Initial Impression / Assessment and Plan / ED Course  I have reviewed the triage vital signs and the nursing notes.  Pertinent labs & imaging results that were available during my care of the patient were reviewed by me and considered in my medical decision making (see chart for details).      Patient Vitals for the past 24 hrs:  BP Temp Temp src Pulse Resp SpO2 Height Weight  02/04/17 1004 139/87 98.2 F (36.8 C) Oral 73 15 100 % 5\' 10"  (1.778 m) 95.3 kg (210 lb)    1:03 PM Reevaluation with update and discussion. After initial assessment and treatment, an updated evaluation reveals no change in clinical status.  Findings discussed with the patient and all questions were answered. Daleen Bo    Final Clinical Impressions(s) / ED Diagnoses   Final diagnoses:  Muscle strain   Evaluation consistent with muscle strain.  Doubt fracture.  Doubt herniated disc or neuropathy.  Nursing Notes Reviewed/ Care Coordinated Applicable Imaging Reviewed Interpretation of Laboratory Data incorporated into ED treatment  The patient appears reasonably screened and/or stabilized for discharge and I doubt any other medical condition or other [facility] requiring further screening, evaluation, or treatment in the ED at this time prior to discharge.  Plan: Home Medications-OTC anti-inflammatory medication, continue other current medications; Home Treatments-return to work 02/08/17, followed by light duty for 5 days ,rest, cryotherapy advance to heat therapy; return here if the recommended treatment, does not improve the symptoms; Recommended follow up-PCP, as needed   ED Discharge Orders    None        Daleen Bo, MD 02/04/17 1311

## 2017-02-04 NOTE — ED Triage Notes (Signed)
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 Patient about 400lbs pulled her weight backwards causing patient to have back pains esp with movement. Since patient was on the job had to be seen for worker's comp case.

## 2017-02-04 NOTE — ED Notes (Signed)
Pt ambulatory and independent at discharge.  Verbalized understanding of discharge instructions 

## 2017-02-04 NOTE — Discharge Instructions (Signed)
Use ice on the sore area of your upper back, 3 or 4 times a day for 2 or 3 days.  After that use heat.  Once you are in the heat phase of treatment, start gently stretching out your upper back.  For pain, use ibuprofen, 400 mg, 3 times a day with meals.

## 2017-10-07 ENCOUNTER — Ambulatory Visit: Payer: No Typology Code available for payment source | Admitting: Internal Medicine

## 2017-10-07 NOTE — Progress Notes (Signed)
Chief Complaint  Patient presents with  . Ear Problem    left ear - spot on ear, bothersome x 1 week ago. Brown spot, itching and burning. H/o skin cancer in family, Melanoma and BC/Tracy Carcinomas    HPI: Sydney Hale 47 y.o. come in for appt   PCP NA     Left pinna ear  Stinging itching  For a week but concern if could be important with family hx fo melanonma in gm and bcca etc in mom and aunt.    Mgm died from melanoma  Last cpx ia yar ago and is well.  . Doesn't have derm.  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 Abdominal pain, RLQ 10/22/2012  . Hemorrhagic ovarian cyst 10/23/2012  . PONV (postoperative nausea and vomiting)   . S/P endometrial ablation   . S/P tubal ligation     Family History  Problem Relation Age of Onset  . Hyperlipidemia Mother   . Diabetes Mother   . Hyperlipidemia Father   . Cancer Paternal Grandmother     Social History   Socioeconomic History  . Marital status: Married    Spouse name: Not on file  . Number of children: Not on file  . Years of education: Not on file  . Highest education level: Not on file  Occupational History  . Not on file  Social Needs  . Financial resource strain: Not on file  . Food insecurity:    Worry: Not on file    Inability: Not on file  . Transportation needs:    Medical: Not on file    Non-medical: Not on file  Tobacco Use  . Smoking status: Never Smoker  . Smokeless tobacco: Never Used  Substance and Sexual Activity  . Alcohol use: No  . Drug use: No  . Sexual activity: Yes    Birth control/protection: None  Lifestyle  . Physical activity:    Days per week: Not on file    Minutes per session: Not on file  . Stress: Not on file  Relationships  . Social connections:    Talks on phone: Not on file    Gets together: Not on file    Attends religious service: Not on file      Active member of club or organization: Not on file    Attends meetings of clubs or organizations: Not on file    Relationship status: Not on file  Other Topics Concern  . Not on file  Social History Narrative  . Not on file    Outpatient Medications Prior to Visit  Medication Sig Dispense Refill  . Multiple Vitamin (MULTIVITAMIN WITH MINERALS) TABS tablet Take 1 tablet by mouth daily.    Marland Kitchen acetaminophen (TYLENOL) 500 MG tablet Take 500 mg by mouth every 6 (six) hours as needed.    . benzonatate (TESSALON) 200 MG capsule Take 1 capsule (200 mg total) by mouth 3 (three) times daily as needed. (Patient not taking: Reported on 10/08/2017) 30 capsule 0  . ibuprofen (ADVIL,MOTRIN) 200 MG tablet Take 600 mg by mouth every 6 (six) hours as needed.     No facility-administered medications prior to visit.      EXAM:  BP 112/76 (BP Location: Right Arm, Patient Position: Sitting, Cuff Size: Normal)   Pulse 71   Temp 98.3 F (36.8 C) (  Oral)   Wt 213 lb 1.6 oz (96.7 kg)   BMI 30.58 kg/m   Body mass index is 30.58 kg/m.  GENERAL: vitals reviewed and listed above, alert, oriented, appears well hydrated and in no acute distress HEENT: atraumatic, conjunctiva  clear, no obvious abnormalities on inspection of external nose and ears  Top left pinna a faint brown freckle non palpable    No lesion no scaling some excoriation    PSYCH: pleasant and cooperative, no obvious depression or anxiety  BP Readings from Last 3 Encounters:  10/08/17 112/76  02/04/17 127/84  07/03/16 127/88    ASSESSMENT AND PLAN:  Discussed the following assessment and plan:  Lesion of pinna, left - Plan: Ambulatory referral to Dermatology  Skin lesion - Plan: Ambulatory referral to Dermatology  Family history of melanoma - Plan: Ambulatory referral to Dermatology Agree that derm should check although  This lesion may be benign   Try otc ointment and  Refer to derm for  Evaluation and check  Had some other ?s   Advise  cpx  pcp    To address  Some quivering in bowel non painful without bowel habit changes  Systemic sx .   Will be  -Patient advised to return or notify health care team  if  new concerns arise.  Patient Instructions  Try otc  Hydrocortisone ointment .    And stop scratching and will do a referral .  Will   Do a dermatology referral as discussed .  Do your  mammo and     Get appt.   for  Cps with gyne or  A new pcp.      Standley Brooking. Panosh M.D.

## 2017-10-08 ENCOUNTER — Encounter: Payer: Self-pay | Admitting: Internal Medicine

## 2017-10-08 ENCOUNTER — Ambulatory Visit: Payer: BLUE CROSS/BLUE SHIELD | Admitting: Internal Medicine

## 2017-10-08 VITALS — BP 112/76 | HR 71 | Temp 98.3°F | Wt 213.1 lb

## 2017-10-08 DIAGNOSIS — H61102 Unspecified noninfective disorders of pinna, left ear: Secondary | ICD-10-CM

## 2017-10-08 DIAGNOSIS — L989 Disorder of the skin and subcutaneous tissue, unspecified: Secondary | ICD-10-CM

## 2017-10-08 DIAGNOSIS — Z808 Family history of malignant neoplasm of other organs or systems: Secondary | ICD-10-CM | POA: Diagnosis not present

## 2017-10-08 NOTE — Patient Instructions (Addendum)
Try otc  Hydrocortisone ointment .    And stop scratching and will do a referral .  Will   Do a dermatology referral as discussed .  Do your  mammo and     Get appt.   for  Cps with gyne or  A new pcp.

## 2017-10-23 NOTE — Progress Notes (Signed)
Sydney Hale DOB: September 19, 1970 Encounter date: 10/24/2017  This is a 47 y.o. female who presents to establish care. Chief Complaint  Patient presents with  . Transitions Of Care    pt states that her muscles "twitch", happens at least 6-8 times a day, no pain/discomfort    History of present illness: [facility]; very sore throat. Strep throat negative, but culture sent. Just been tired this week. Struggled a bit. No fevers. Chilled.   Used to be random that she would feel twitching muscle, but noticed more and more a couple of months ago. Not painful, but would twitch until point it almost felt fatigued. Notes it now all over. Feels this every day, calf, arms, bilat, even abd but not face. Feels a little more tired with driving; trying not to over react but it is worsening. Lasts a few minutes; less than 10. Not necessarily noting it with activity. Not feeling particularly stressed. Notes more at night because lying down and quiet, but it does occur all day long.   Has been trying to lose weight. Drinking more water, eating healthier.    Past Medical History:  Diagnosis Date  . Abdominal pain, RLQ 10/22/2012  . Hemorrhagic ovarian cyst 10/23/2012  . PONV (postoperative nausea and vomiting)   . S/P endometrial ablation   . S/P tubal ligation    Past Surgical History:  Procedure Laterality Date  . CESAREAN SECTION     x3  . INCISION AND DRAINAGE BREAST ABSCESS  2000  . TUBAL LIGATION     Allergies  Allergen Reactions  . Erythromycin Other (See Comments)    Unknown, was told by mother  . Hydrocodone     Nausea and vomiting   No outpatient medications have been marked as taking for the 10/24/17 encounter (Office Visit) with Caren Macadam, MD.   Social History   Tobacco Use  . Smoking status: Never Smoker  . Smokeless tobacco: Never Used  Substance Use Topics  . Alcohol use: No   Family History  Problem Relation Age of Onset  . Hyperlipidemia  Mother   . Diabetes Mother   . CAD Mother 14  . Heart attack Mother 41       previous stent re-occluded  . Hyperlipidemia Father   . Bladder Cancer Father        tobacco abuse  . Colon polyps Father   . Epilepsy Father   . Cancer Paternal Grandmother        colon?  . Other Sister        hyperglycemia  . High Cholesterol Sister   . Epilepsy Brother   . Melanoma Maternal Grandmother 44  . Other Maternal Grandfather        no family history known  . Other Paternal Grandfather        car accident  . Alcohol abuse Brother        esoph varices     Review of Systems  Constitutional: Positive for chills. Negative for fatigue and fever.  HENT: Positive for sore throat. Negative for congestion, sinus pressure and sneezing.   Respiratory: Negative for cough, chest tightness, shortness of breath and wheezing.   Cardiovascular: Negative for chest pain, palpitations and leg swelling.  Gastrointestinal: Positive for abdominal pain (states she always has abdominal pain; has varices internally that have been named as culprit). Negative for blood in stool.  Genitourinary: Negative for menstrual problem (missed a couple of period cycles; not as heavy  as they used to be).  Musculoskeletal: Negative for arthralgias and back pain.  Neurological: Positive for weakness (legs) and numbness (tips of fingers; slight). Negative for dizziness, tremors, syncope and headaches.    Objective:  BP 122/60 (BP Location: Left Arm, Patient Position: Sitting, Cuff Size: Normal)   Pulse 81   Temp 98.4 F (36.9 C) (Oral)   Wt 208 lb 6.4 oz (94.5 kg)   SpO2 97%   BMI 29.90 kg/m   Weight: 208 lb 6.4 oz (94.5 kg)   BP Readings from Last 3 Encounters:  10/24/17 122/60  10/08/17 112/76  02/04/17 127/84   Wt Readings from Last 3 Encounters:  10/24/17 208 lb 6.4 oz (94.5 kg)  10/08/17 213 lb 1.6 oz (96.7 kg)  02/04/17 210 lb (95.3 kg)    Physical Exam  Constitutional: She is oriented to person, place,  and time. She appears well-developed and well-nourished. No distress.  HENT:  Head: Normocephalic and atraumatic.  Mouth/Throat: Uvula is midline and mucous membranes are normal. Oropharyngeal exudate (left) and posterior oropharyngeal erythema present.  Eyes: Pupils are equal, round, and reactive to light.  Cardiovascular: Normal rate, regular rhythm and normal heart sounds. Exam reveals no friction rub.  No murmur heard. No lower extremity edema  Pulmonary/Chest: Effort normal and breath sounds normal. No respiratory distress. She has no wheezes. She has no rales.  Abdominal: Soft. Bowel sounds are normal. She exhibits no distension and no mass. There is no tenderness. There is no guarding.  Neurological: She is alert and oriented to person, place, and time. Coordination and gait normal.  Reflex Scores:      Tricep reflexes are 2+ on the right side and 2+ on the left side.      Bicep reflexes are 2+ on the right side and 2+ on the left side.      Brachioradialis reflexes are 2+ on the right side and 2+ on the left side.      Patellar reflexes are 1+ on the right side and 1+ on the left side.      Achilles reflexes are 2+ on the right side and 2+ on the left side. Left hand slower with rapid alternating movements.  Difficulty with tandem gait walking (uncertain if this is normal for her)  Hip flexor strength is 4+/5 bilat; otherwise strength testing is normal.   Psychiatric: She has a normal mood and affect.    Assessment/Plan:  1. Muscle twitching Further evaluation pending blood work results. - CBC with Differential/Platelet; Future - Comprehensive metabolic panel; Future - TSH; Future - Vitamin B12; Future - Magnesium - CBC with Differential/Platelet - Comprehensive metabolic panel - TSH - Vitamin B12  2. Pharyngitis, unspecified etiology Printed antibiotic in case of significant worsening of symptoms over the weekend.  She does [facility] however and will try to wait to hear about these results - azithromycin (ZITHROMAX) 250 MG tablet; 2 tabs PO day one, then 1 tab PO daily.  Dispense: 6 tablet; Refill: 0  3. Lipid screening - Lipid panel; Future - Lipid panel  Return pending blood work.  Micheline Rough, MD

## 2017-10-24 ENCOUNTER — Encounter: Payer: Self-pay | Admitting: Family Medicine

## 2017-10-24 ENCOUNTER — Ambulatory Visit (INDEPENDENT_AMBULATORY_CARE_PROVIDER_SITE_OTHER): Payer: BLUE CROSS/BLUE SHIELD | Admitting: Family Medicine

## 2017-10-24 VITALS — BP 122/60 | HR 81 | Temp 98.4°F | Wt 208.4 lb

## 2017-10-24 DIAGNOSIS — J029 Acute pharyngitis, unspecified: Secondary | ICD-10-CM

## 2017-10-24 DIAGNOSIS — R253 Fasciculation: Secondary | ICD-10-CM | POA: Diagnosis not present

## 2017-10-24 DIAGNOSIS — Z13220 Encounter for screening for lipoid disorders: Secondary | ICD-10-CM | POA: Diagnosis not present

## 2017-10-24 DIAGNOSIS — J020 Streptococcal pharyngitis: Secondary | ICD-10-CM | POA: Diagnosis not present

## 2017-10-24 MED ORDER — AZITHROMYCIN 250 MG PO TABS: ORAL_TABLET | ORAL | 0 refills | Status: DC

## 2017-10-24 NOTE — Patient Instructions (Signed)
We have ordered labs or studies at this visit. It can take up to 1-2 weeks for results and processing. We will call you with results or post them in [place] IF EVERYTHING IS NORMAL. If you have not heard from [place] or cannot find your results in [facility] in 2 weeks please contact our office at [phone]. If we are working up a problem that starts to worsen while you are waiting for your results please contact us sooner.  If you are not yet signed up for [facility], please consider signing up. This is an easy way for [place] to communicate with each other and allows you to access your results and chart notes.

## 2017-10-28 ENCOUNTER — Other Ambulatory Visit: Payer: BLUE CROSS/BLUE SHIELD

## 2017-10-28 DIAGNOSIS — Z13220 Encounter for screening for lipoid disorders: Secondary | ICD-10-CM

## 2017-10-28 DIAGNOSIS — R253 Fasciculation: Secondary | ICD-10-CM | POA: Diagnosis not present

## 2017-10-28 LAB — LIPID PANEL
CHOL/HDL RATIO: 5
Cholesterol: 164 mg/dL (ref 0–200)
HDL: 31 mg/dL — ABNORMAL LOW (ref >39.00)
LDL Cholesterol: 111 mg/dL — ABNORMAL HIGH (ref 0–99)
NonHDL: 132.75
TRIGLYCERIDES: 111 mg/dL (ref 0.0–149.0)
VLDL: 22.2 mg/dL (ref 0.0–40.0)

## 2017-10-28 LAB — VITAMIN B12: VITAMIN B 12: 324 pg/mL (ref 211–911)

## 2017-10-28 LAB — CBC WITH DIFFERENTIAL/PLATELET
BASOS PCT: 0.6 % (ref 0.0–3.0)
Basophils Absolute: 0 10*3/uL (ref 0.0–0.1)
EOS PCT: 3.4 % (ref 0.0–5.0)
Eosinophils Absolute: 0.2 10*3/uL (ref 0.0–0.7)
HEMATOCRIT: 39.8 % (ref 36.0–46.0)
Hemoglobin: 13.2 g/dL (ref 12.0–15.0)
Lymphocytes Relative: 36 % (ref 12.0–46.0)
Lymphs Abs: 2.4 10*3/uL (ref 0.7–4.0)
MCHC: 33.1 g/dL (ref 30.0–36.0)
MCV: 76.6 fl — ABNORMAL LOW (ref 78.0–100.0)
MONO ABS: 0.4 10*3/uL (ref 0.1–1.0)
Monocytes Relative: 5.5 % (ref 3.0–12.0)
NEUTROS ABS: 3.6 10*3/uL (ref 1.4–7.7)
Neutrophils Relative %: 54.5 % (ref 43.0–77.0)
PLATELETS: 269 10*3/uL (ref 150.0–400.0)
RBC: 5.2 Mil/uL — ABNORMAL HIGH (ref 3.87–5.11)
RDW: 14.6 % (ref 11.5–15.5)
WBC: 6.7 10*3/uL (ref 4.0–10.5)

## 2017-10-28 LAB — COMPREHENSIVE METABOLIC PANEL
ALT: 16 U/L (ref 0–35)
AST: 13 U/L (ref 0–37)
Albumin: 4.3 g/dL (ref 3.5–5.2)
Alkaline Phosphatase: 40 U/L (ref 39–117)
BILIRUBIN TOTAL: 0.5 mg/dL (ref 0.2–1.2)
BUN: 15 mg/dL (ref 6–23)
CALCIUM: 9.7 mg/dL (ref 8.4–10.5)
CHLORIDE: 105 meq/L (ref 96–112)
CO2: 26 meq/L (ref 19–32)
CREATININE: 0.86 mg/dL (ref 0.40–1.20)
GFR: 75.01 mL/min (ref >60.00)
Glucose, Bld: 100 mg/dL — ABNORMAL HIGH (ref 70–99)
Potassium: 3.9 mEq/L (ref 3.5–5.1)
SODIUM: 138 meq/L (ref 135–145)
Total Protein: 7 g/dL (ref 6.0–8.3)

## 2017-10-28 LAB — MAGNESIUM: Magnesium: 2 mg/dL (ref 1.5–2.5)

## 2017-10-28 LAB — TSH: TSH: 1.23 u[IU]/mL (ref 0.35–4.50)

## 2017-10-30 ENCOUNTER — Other Ambulatory Visit: Payer: Self-pay | Admitting: Family Medicine

## 2017-10-30 DIAGNOSIS — R253 Fasciculation: Secondary | ICD-10-CM

## 2017-12-03 DIAGNOSIS — L218 Other seborrheic dermatitis: Secondary | ICD-10-CM | POA: Diagnosis not present

## 2017-12-03 DIAGNOSIS — D225 Melanocytic nevi of trunk: Secondary | ICD-10-CM | POA: Diagnosis not present

## 2017-12-03 DIAGNOSIS — L821 Other seborrheic keratosis: Secondary | ICD-10-CM | POA: Diagnosis not present

## 2017-12-03 DIAGNOSIS — D1801 Hemangioma of skin and subcutaneous tissue: Secondary | ICD-10-CM | POA: Diagnosis not present

## 2018-02-19 DIAGNOSIS — Z1231 Encounter for screening mammogram for malignant neoplasm of breast: Secondary | ICD-10-CM | POA: Diagnosis not present

## 2018-02-19 LAB — HM MAMMOGRAPHY

## 2018-02-20 ENCOUNTER — Encounter: Payer: Self-pay | Admitting: Family Medicine

## 2018-02-26 ENCOUNTER — Encounter: Payer: Self-pay | Admitting: Family Medicine

## 2018-02-26 DIAGNOSIS — N6321 Unspecified lump in the left breast, upper outer quadrant: Secondary | ICD-10-CM | POA: Diagnosis not present

## 2018-02-26 DIAGNOSIS — D242 Benign neoplasm of left breast: Secondary | ICD-10-CM | POA: Diagnosis not present

## 2018-03-10 DIAGNOSIS — Z01419 Encounter for gynecological examination (general) (routine) without abnormal findings: Secondary | ICD-10-CM | POA: Diagnosis not present

## 2018-03-10 DIAGNOSIS — Z124 Encounter for screening for malignant neoplasm of cervix: Secondary | ICD-10-CM | POA: Diagnosis not present

## 2018-03-10 DIAGNOSIS — R69 Illness, unspecified: Secondary | ICD-10-CM | POA: Diagnosis not present

## 2018-03-10 DIAGNOSIS — N9089 Other specified noninflammatory disorders of vulva and perineum: Secondary | ICD-10-CM | POA: Diagnosis not present

## 2018-05-02 DIAGNOSIS — S86012A Strain of left Achilles tendon, initial encounter: Secondary | ICD-10-CM | POA: Diagnosis not present

## 2018-05-02 DIAGNOSIS — Y999 Unspecified external cause status: Secondary | ICD-10-CM | POA: Diagnosis not present

## 2018-05-02 DIAGNOSIS — X500XXA Overexertion from strenuous movement or load, initial encounter: Secondary | ICD-10-CM | POA: Diagnosis not present

## 2018-05-04 DIAGNOSIS — M7660 Achilles tendinitis, unspecified leg: Secondary | ICD-10-CM | POA: Diagnosis not present

## 2018-05-04 DIAGNOSIS — S86112A Strain of other muscle(s) and tendon(s) of posterior muscle group at lower leg level, left leg, initial encounter: Secondary | ICD-10-CM | POA: Diagnosis not present

## 2018-05-28 DIAGNOSIS — S86112A Strain of other muscle(s) and tendon(s) of posterior muscle group at lower leg level, left leg, initial encounter: Secondary | ICD-10-CM | POA: Diagnosis not present

## 2018-06-01 ENCOUNTER — Encounter: Payer: Self-pay | Admitting: Family Medicine

## 2018-06-01 ENCOUNTER — Ambulatory Visit: Payer: Self-pay | Admitting: *Deleted

## 2018-06-01 ENCOUNTER — Other Ambulatory Visit: Payer: Self-pay

## 2018-06-01 ENCOUNTER — Ambulatory Visit (INDEPENDENT_AMBULATORY_CARE_PROVIDER_SITE_OTHER): Payer: Self-pay | Admitting: Family Medicine

## 2018-06-01 DIAGNOSIS — R42 Dizziness and giddiness: Secondary | ICD-10-CM

## 2018-06-01 MED ORDER — MECLIZINE HCL 25 MG PO TABS: 25.0000 mg | ORAL_TABLET | ORAL | 0 refills | Status: DC | PRN

## 2018-06-01 NOTE — Telephone Encounter (Signed)
Appointment made

## 2018-06-01 NOTE — Progress Notes (Signed)
Subjective:    Patient ID: Sydney Hale, female    DOB: May 14, 1970, 48 y.o.   MRN: [id number]  HPI Virtual Visit via Video Note  I connected with the patient on 06/01/18 at  2:15 PM EDT by a video enabled telemedicine application and verified that I am speaking with the correct person using two identifiers.  Location patient: home Location provider:work or home office Persons participating in the virtual visit: patient, provider  I discussed the limitations of evaluation and management by telemedicine and the availability of in person appointments. The patient expressed understanding and agreed to proceed.   HPI: Here to describe dizziness that started 3 days ago. This waxes and wanes. She sometimes feels like the room is spinning around her, and at times she feels like she wants to lean to the left. No headaches, no blurred vision, no other neurologic deficits. She has been nauseated but has not vomited. No sinus pressure or URI symptoms.    ROS: See pertinent positives and negatives per HPI.  Past Medical History:  Diagnosis Date  . Abdominal pain, RLQ 10/22/2012  . Hemorrhagic ovarian cyst 10/23/2012  . PONV (postoperative nausea and vomiting)   . S/P endometrial ablation   . S/P tubal ligation     Past Surgical History:  Procedure Laterality Date  . CESAREAN SECTION     x3  . INCISION AND DRAINAGE BREAST ABSCESS  2000  . TUBAL LIGATION      Family History  Problem Relation Age of Onset  . Hyperlipidemia Mother   . Diabetes Mother   . CAD Mother 61  . Heart attack Mother 86       previous stent re-occluded  . Hyperlipidemia Father   . Bladder Cancer Father        tobacco abuse  . Colon polyps Father   . Epilepsy Father   . Cancer Paternal Grandmother        colon?  . Other Sister        hyperglycemia  . High Cholesterol Sister   . Epilepsy Brother   . Melanoma Maternal Grandmother 30  . Other Maternal Grandfather        no family history known  .  Other Paternal Grandfather        car accident  . Alcohol abuse Brother        esoph varices     Current Outpatient Medications:  .  acetaminophen (TYLENOL) 500 MG tablet, Take 500 mg by mouth every 6 (six) hours as needed., Disp: , Rfl:  .  benzonatate (TESSALON) 200 MG capsule, Take 1 capsule (200 mg total) by mouth 3 (three) times daily as needed., Disp: 30 capsule, Rfl: 0 .  ibuprofen (ADVIL,MOTRIN) 200 MG tablet, Take 600 mg by mouth every 6 (six) hours as needed., Disp: , Rfl:  .  Multiple Vitamin (MULTIVITAMIN WITH MINERALS) TABS tablet, Take 1 tablet by mouth daily., Disp: , Rfl:   EXAM:  VITALS per patient if applicable:  GENERAL: alert, oriented, appears well and in no acute distress  HEENT: atraumatic, conjunttiva clear, no obvious abnormalities on inspection of external nose and ears  NECK: normal movements of the head and neck  LUNGS: on inspection no signs of respiratory distress, breathing rate appears normal, no obvious gross SOB, gasping or wheezing  CV: no obvious cyanosis  MS: moves all visible extremities without noticeable abnormality  PSYCH/NEURO: pleasant and cooperative, no obvious depression or anxiety, speech and thought processing grossly intact  ASSESSMENT  AND PLAN: Vertigo, likely vestibular in origin. She will drink plenty of fluids. Take Claritin BID until this passes. Use Meclizine prn. She will follow up with [place] if not better in one week.  Alysia Penna, MD  Discussed the following assessment and plan:  No diagnosis found.     I discussed the assessment and treatment plan with the patient. The patient was provided an opportunity to ask questions and all were answered. The patient agreed with the plan and demonstrated an understanding of the instructions.   The patient was advised to call back or seek an in-person evaluation if the symptoms worsen or if the condition fails to improve as anticipated.     Review of Systems     Objective:    Physical Exam        Assessment & Plan:

## 2018-06-01 NOTE — Telephone Encounter (Signed)
C/o dizziness Started on Saturday with a headache.  Stumbling on Sunday. No stumbling this morning but having the dizziness. Denies room spinning or tilting, nausea,vomiting or chest pain. Looking down, changing her head position and standing makes it worst. States drinking plenty of fluids. Home care advice given to patient with verbal understanding. Flow at [facility] at [facility] notified regarding appointment. Call was conference in to the office. Triage routed to office. Reason for Disposition . [1] MODERATE dizziness (e.g., interferes with normal activities) AND [2] has NOT been evaluated by physician for this  (Exception: dizziness caused by heat exposure, sudden standing, or poor fluid intake)  Answer Assessment - Initial Assessment Questions 1. DESCRIPTION: "Describe your dizziness."     Feels off 2. LIGHTHEADED: "Do you feel lightheaded?" (e.g., somewhat faint, woozy, weak upon standing)     Woozy not all the time 3. VERTIGO: "Do you feel like either you or the room is spinning or tilting?" (i.e. vertigo)     no 4. SEVERITY: "How bad is it?"  "Do you feel like you are going to faint?" "Can you stand and walk?"   - MILD - walking normally   - MODERATE - interferes with normal activities (e.g., work, school)    - SEVERE - unable to stand, requires support to walk, feels like passing out now.      moderate 5. ONSET:  "When did the dizziness begin?"     Saturday 6. AGGRAVATING FACTORS: "Does anything make it worse?" (e.g., standing, change in head position)     Changing position, standing 7. HEART RATE: "Can you tell me your heart rate?" "How many beats in 15 seconds?"  (Note: not all patients can do this)       HR 70-80's 8. CAUSE: "What do you think is causing the dizziness?"     Not sure 9. RECURRENT SYMPTOM: "Have you had dizziness before?" If so, ask: "When was the last time?" "What happened that time?"     no 10. OTHER SYMPTOMS: "Do you have any other symptoms?" (e.g., fever,  chest pain, vomiting, diarrhea, bleeding)       no 11. PREGNANCY: "Is there any chance you are pregnant?" "When was your last menstrual period?"       LMP was on April 16 th, had tubal  Protocols used: DIZZINESS [facility]

## 2018-07-31 ENCOUNTER — Ambulatory Visit (INDEPENDENT_AMBULATORY_CARE_PROVIDER_SITE_OTHER): Payer: 59 | Admitting: Internal Medicine

## 2018-07-31 ENCOUNTER — Other Ambulatory Visit: Payer: Self-pay

## 2018-07-31 ENCOUNTER — Ambulatory Visit: Payer: Self-pay | Admitting: *Deleted

## 2018-07-31 ENCOUNTER — Telehealth: Payer: Self-pay | Admitting: *Deleted

## 2018-07-31 DIAGNOSIS — Z20822 Contact with and (suspected) exposure to covid-19: Secondary | ICD-10-CM

## 2018-07-31 DIAGNOSIS — Z20828 Contact with and (suspected) exposure to other viral communicable diseases: Secondary | ICD-10-CM | POA: Diagnosis not present

## 2018-07-31 DIAGNOSIS — R6889 Other general symptoms and signs: Secondary | ICD-10-CM | POA: Diagnosis not present

## 2018-07-31 NOTE — Telephone Encounter (Signed)
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 Reason for Disposition . [1] COVID-19 infection suspected by caller or triager AND [2] mild symptoms (cough, fever, or others) AND [2] no complications or SOB  Answer Assessment - Initial Assessment Questions 1. COVID-19 DIAGNOSIS: "Who made your Coronavirus (COVID-19) diagnosis?" "Was it confirmed by a positive lab test?" If not diagnosed by a HCP, ask "Are there lots of cases (community spread) where you live?" (See public health department website, if unsure)     Community spread 2. ONSET: "When did the COVID-19 symptoms start?"     07/28/2018 3. WORST SYMPTOM: "What is your worst symptom?" (e.g., cough, fever, shortness of breath, muscle aches)     Sore throat 4. COUGH: "Do you have a cough?" If so, ask: "How bad is the cough?"     mild 5. FEVER: "Do you have a fever?" If so, ask: "What is your temperature, how was it measured, and when did it start?"     99.6 on 07/30/2018 6. RESPIRATORY STATUS: "Describe your breathing?" (e.g., shortness of breath, wheezing, unable to speak)      Breathing ok 7. BETTER-SAME-WORSE: "Are you getting better, staying the same or getting worse compared to yesterday?"  If getting worse, ask, "In what way?"     Worse because she did not have a sore throat on 07/30/2018 8. HIGH RISK DISEASE: "Do you have any chronic medical problems?" (e.g., asthma, heart or lung disease, weak immune system, etc.)    Protein S  deficiency 9. PREGNANCY: "Is there any chance you are pregnant?" "When was your last menstrual period?"    No. Tubal ligation 10. OTHER SYMPTOMS: "Do you have any other symptoms?"  (e.g., chills, fatigue, headache, loss of smell or taste, muscle pain, sore throat)      headach  Protocols used: CORONAVIRUS (COVID-19) DIAGNOSED OR SUSPECTED-A-AH

## 2018-07-31 NOTE — Progress Notes (Signed)
Virtual Visit via Video Note  I connected with Sydney Hale on 07/31/18 at  1:00 PM EDT by a video enabled telemedicine application and verified that I am speaking with the correct person using two identifiers.  Location patient: home Location provider: work office Persons participating in the virtual visit: patient, provider  I discussed the limitations of evaluation and management by telemedicine and the availability of in person appointments. The patient expressed understanding and agreed to proceed.   HPI: She is a Barrister's clerk. She has a daughter with CP who has a caregiver. The caregiver is a Chartered certified accountant at [employer]. She worked the weekend at [employer] and on Monday was at patient's home. Caregiver was informed on Tuesday that she had exposure to a COVID patient without adequate PPE and has been taken out of work. Because of this exposure the patient was also taken out of work. Patient herself has no symptoms other than a slightly sore throat. She needs a negative COVID test before she can return.    ROS: Constitutional: Denies fever, chills, diaphoresis, appetite change and fatigue.  HEENT: Denies photophobia, eye pain, redness, hearing loss, ear pain, congestion,  rhinorrhea, sneezing, mouth sores, trouble swallowing, neck pain, neck stiffness and tinnitus.   Respiratory: Denies SOB, DOE, cough, chest tightness,  and wheezing.   Cardiovascular: Denies chest pain, palpitations and leg swelling.  Gastrointestinal: Denies nausea, vomiting, abdominal pain, diarrhea, constipation, blood in stool and abdominal distention.  Genitourinary: Denies dysuria, urgency, frequency, hematuria, flank pain and difficulty urinating.  Endocrine: Denies: hot or cold intolerance, sweats, changes in hair or nails, polyuria, polydipsia. Musculoskeletal: Denies myalgias, back pain, joint swelling, arthralgias and gait problem.  Skin: Denies pallor, rash and wound.  Neurological: Denies dizziness,  seizures, syncope, weakness, light-headedness, numbness and headaches.  Hematological: Denies adenopathy. Easy bruising, personal or family bleeding history  Psychiatric/Behavioral: Denies suicidal ideation, mood changes, confusion, nervousness, sleep disturbance and agitation   Past Medical History:  Diagnosis Date  . Abdominal pain, RLQ 10/22/2012  . Hemorrhagic ovarian cyst 10/23/2012  . PONV (postoperative nausea and vomiting)   . S/P endometrial ablation   . S/P tubal ligation     Past Surgical History:  Procedure Laterality Date  . CESAREAN SECTION     x3  . INCISION AND DRAINAGE BREAST ABSCESS  2000  . TUBAL LIGATION      Family History  Problem Relation Age of Onset  . Hyperlipidemia Mother   . Diabetes Mother   . CAD Mother 44  . Heart attack Mother 67       previous stent re-occluded  . Hyperlipidemia Father   . Bladder Cancer Father        tobacco abuse  . Colon polyps Father   . Epilepsy Father   . Cancer Paternal Grandmother        colon?  . Other Sister        hyperglycemia  . High Cholesterol Sister   . Epilepsy Brother   . Melanoma Maternal Grandmother 52  . Other Maternal Grandfather        no family history known  . Other Paternal Grandfather        car accident  . Alcohol abuse Brother        esoph varices    SOCIAL HX:   reports that she has never smoked. She has never used smokeless tobacco. She reports that she does not drink alcohol or use drugs.   Current Outpatient Medications:  .  acetaminophen (TYLENOL) 500 MG tablet, Take 500 mg by mouth every 6 (six) hours as needed., Disp: , Rfl:  .  benzonatate (TESSALON) 200 MG capsule, Take 1 capsule (200 mg total) by mouth 3 (three) times daily as needed., Disp: 30 capsule, Rfl: 0 .  ibuprofen (ADVIL,MOTRIN) 200 MG tablet, Take 600 mg by mouth every 6 (six) hours as needed., Disp: , Rfl:  .  meclizine (ANTIVERT) 25 MG tablet, Take 1 tablet (25 mg total) by mouth every 4 (four) hours as needed  for dizziness., Disp: 60 tablet, Rfl: 0 .  Multiple Vitamin (MULTIVITAMIN WITH MINERALS) TABS tablet, Take 1 tablet by mouth daily., Disp: , Rfl:   EXAM:   VITALS per patient if applicable: Tmax of 34.3  GENERAL: alert, oriented, appears well and in no acute distress  HEENT: atraumatic, conjunttiva clear, no obvious abnormalities on inspection of external nose and ears  NECK: normal movements of the head and neck  LUNGS: on inspection no signs of respiratory distress, breathing rate appears normal, no obvious gross increased work of breathing, gasping or wheezing  CV: no obvious cyanosis  MS: moves all visible extremities without noticeable abnormality  PSYCH/NEURO: pleasant and cooperative, no obvious depression or anxiety, speech and thought processing grossly intact  ASSESSMENT AND PLAN:   Exposure to Covid-19 Virus  -Her only symptom is a scratchy throat. 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 -Will order and patient will be notified of details.     I discussed the assessment and treatment plan with the patient. The patient was provided an opportunity to ask questions and all were answered. The patient agreed with the plan and demonstrated an understanding of the instructions.   The patient was advised to call back or seek an in-person evaluation if the symptoms worsen or if the condition fails to improve as anticipated.    Lelon Frohlich, MD  [facility] at [facility]

## 2018-07-31 NOTE — Telephone Encounter (Signed)
Left voicemail for patient on mobile number to call back to schedule COVID 19 test.  Home number mailbox was full. Test ordered.

## 2018-07-31 NOTE — Telephone Encounter (Signed)
Pt scheduled for virtual visit 

## 2018-07-31 NOTE — Telephone Encounter (Signed)
covid testing Received: Today Message Contents  Lamarr Lulas, CMA  [facility] Testing Pool        Good afternoon,   Dr Jerilee Hoh would like Covid test for her patient:   Sydney Hale  [id number]  1970/08/20   Dx Exposure to Covid   thanks

## 2018-08-06 LAB — NOVEL CORONAVIRUS, NAA: SARS-CoV-2, NAA: NOT DETECTED

## 2018-09-05 DIAGNOSIS — Z20828 Contact with and (suspected) exposure to other viral communicable diseases: Secondary | ICD-10-CM | POA: Diagnosis not present

## 2018-09-05 DIAGNOSIS — J029 Acute pharyngitis, unspecified: Secondary | ICD-10-CM | POA: Diagnosis not present

## 2018-10-01 ENCOUNTER — Telehealth: Payer: Self-pay

## 2018-10-01 ENCOUNTER — Encounter: Payer: Self-pay | Admitting: Family Medicine

## 2018-10-01 ENCOUNTER — Other Ambulatory Visit: Payer: Self-pay

## 2018-10-01 ENCOUNTER — Telehealth (INDEPENDENT_AMBULATORY_CARE_PROVIDER_SITE_OTHER): Payer: Self-pay | Admitting: Family Medicine

## 2018-10-01 DIAGNOSIS — R300 Dysuria: Secondary | ICD-10-CM

## 2018-10-01 MED ORDER — NITROFURANTOIN MONOHYD MACRO 100 MG PO CAPS: 100.0000 mg | ORAL_CAPSULE | Freq: Two times a day (BID) | ORAL | 0 refills | Status: DC

## 2018-10-01 NOTE — Telephone Encounter (Signed)
Copied from [place] [id number]. Topic: General - Other >> Oct 01, 2018  9:00 AM Leward Quan A wrote: Reason for CRM: Patient called to say that she is experiencing the symptoms of a UTI, burning during urination, nausea, sweating, and just not feeling well. She have been doing OTC medications but not helping. Patient can drop off a urine sample she say but please call her and let her know. Ph# [phone]

## 2018-10-01 NOTE — Patient Instructions (Signed)
-  I sent the medication we discussed to the pharmacy. Meds ordered this encounter  Medications  . nitrofurantoin, macrocrystal-monohydrate, (MACROBID) 100 MG capsule    Sig: Take 1 capsule (100 mg total) by mouth 2 (two) times daily.    Dispense:  14 capsule    Refill:  0  Please let us know if you have any questions or concerns regarding this prescription.  I hope you are feeling better soon! Seek care promptly if your symptoms worsen, new concerns arise or you are not improving with treatment.

## 2018-10-01 NOTE — Progress Notes (Signed)
Virtual Visit via Video Note  I connected with Sydney Hale  on 10/01/18 at 11:20 AM EDT by a video enabled telemedicine application and verified that I am speaking with the correct person using two identifiers.  Location patient: home Location provider:work or home office Persons participating in the virtual visit: patient, provider  I discussed the limitations of evaluation and management by telemedicine and the availability of in person appointments. The patient expressed understanding and agreed to proceed.   HPI:  Acute visit for Dysuria: -symptoms started 3 days ago -symptoms include frequency, burning with urination, urgency, mild nausea -she has tried some cranberry tablets and water and this has helped a little -denies fevers, flank/pelvi/abd pain, hematuria, vaginal symptoms, vomiting, concern for STI -FDLMP: a few weeks ago in July, reports is not at all concerned for pregnancy -she has a history of UTI, but has been a long time since she had one   ROS: See pertinent positives and negatives per HPI.  Past Medical History:  Diagnosis Date  . Abdominal pain, RLQ 10/22/2012  . Hemorrhagic ovarian cyst 10/23/2012  . PONV (postoperative nausea and vomiting)   . S/P endometrial ablation   . S/P tubal ligation     Past Surgical History:  Procedure Laterality Date  . CESAREAN SECTION     x3  . INCISION AND DRAINAGE BREAST ABSCESS  2000  . TUBAL LIGATION      Family History  Problem Relation Age of Onset  . Hyperlipidemia Mother   . Diabetes Mother   . CAD Mother 60  . Heart attack Mother 83       previous stent re-occluded  . Hyperlipidemia Father   . Bladder Cancer Father        tobacco abuse  . Colon polyps Father   . Epilepsy Father   . Cancer Paternal Grandmother        colon?  . Other Sister        hyperglycemia  . High Cholesterol Sister   . Epilepsy Brother   . Melanoma Maternal Grandmother 24  . Other Maternal Grandfather        no family history known   . Other Paternal Grandfather        car accident  . Alcohol abuse Brother        esoph varices    SOCIAL HX: see hpi   Current Outpatient Medications:  .  acetaminophen (TYLENOL) 500 MG tablet, Take 500 mg by mouth every 6 (six) hours as needed., Disp: , Rfl:  .  ibuprofen (ADVIL,MOTRIN) 200 MG tablet, Take 600 mg by mouth every 6 (six) hours as needed., Disp: , Rfl:  .  meclizine (ANTIVERT) 25 MG tablet, Take 1 tablet (25 mg total) by mouth every 4 (four) hours as needed for dizziness., Disp: 60 tablet, Rfl: 0 .  Multiple Vitamin (MULTIVITAMIN WITH MINERALS) TABS tablet, Take 1 tablet by mouth daily., Disp: , Rfl:  .  nitrofurantoin, macrocrystal-monohydrate, (MACROBID) 100 MG capsule, Take 1 capsule (100 mg total) by mouth 2 (two) times daily., Disp: 14 capsule, Rfl: 0  EXAM:  VITALS per patient if applicable:denies fever  GENERAL: alert, oriented, appears well and in no acute distress  HEENT: atraumatic, conjunttiva clear, no obvious abnormalities on inspection of external nose and ears  NECK: normal movements of the head and neck  LUNGS: on inspection no signs of respiratory distress, breathing rate appears normal, no obvious gross SOB, gasping or wheezing  CV: no obvious cyanosis  MS: moves all  visible extremities without noticeable abnormality  PSYCH/NEURO: pleasant and cooperative, no obvious depression or anxiety, speech and thought processing grossly intact  ASSESSMENT AND PLAN:  Discussed the following assessment and plan:  Dysuria  -we discussed possible serious and likely etiologies, workup and treatment, treatment risks and return precautions.  -after this discussion, Sydney Hale opted for for empiric treatment for possible uncomplicated cystitis with Macrobid 100mg  bid x 7 days, AZO prn and a probiotic. She agrees to seek care if worsening, not improving on treatment or new concerns arise.   I discussed the assessment and treatment plan with the patient. The  patient was provided an opportunity to ask questions and all were answered. The patient agreed with the plan and demonstrated an understanding of the instructions.  Lucretia Kern, DO   Patient Instructions   -I sent the medication we discussed to the pharmacy. Meds ordered this encounter  Medications  . nitrofurantoin, macrocrystal-monohydrate, (MACROBID) 100 MG capsule    Sig: Take 1 capsule (100 mg total) by mouth 2 (two) times daily.    Dispense:  14 capsule    Refill:  0  Please let us know if you have any questions or concerns regarding this prescription.  I hope you are feeling better soon! Seek care promptly if your symptoms worsen, new concerns arise or you are not improving with treatment.

## 2018-10-01 NOTE — Telephone Encounter (Signed)
Appt scheduled for today at 11:20am.

## 2018-10-01 NOTE — Telephone Encounter (Signed)
Patient stated she lives in [facility], works here in [place], has to see a patient at 12:30pm and will come in for urine testing if recommended after her visit with Dr Maudie Mercury.

## 2018-12-03 DIAGNOSIS — D1801 Hemangioma of skin and subcutaneous tissue: Secondary | ICD-10-CM | POA: Diagnosis not present

## 2018-12-03 DIAGNOSIS — L700 Acne vulgaris: Secondary | ICD-10-CM | POA: Diagnosis not present

## 2018-12-03 DIAGNOSIS — L218 Other seborrheic dermatitis: Secondary | ICD-10-CM | POA: Diagnosis not present

## 2018-12-03 DIAGNOSIS — D225 Melanocytic nevi of trunk: Secondary | ICD-10-CM | POA: Diagnosis not present

## 2018-12-03 DIAGNOSIS — L80 Vitiligo: Secondary | ICD-10-CM | POA: Diagnosis not present

## 2018-12-03 DIAGNOSIS — L821 Other seborrheic keratosis: Secondary | ICD-10-CM | POA: Diagnosis not present

## 2018-12-07 ENCOUNTER — Encounter: Payer: Self-pay | Admitting: Family Medicine

## 2018-12-07 ENCOUNTER — Ambulatory Visit (INDEPENDENT_AMBULATORY_CARE_PROVIDER_SITE_OTHER): Payer: 59 | Admitting: Family Medicine

## 2018-12-07 ENCOUNTER — Other Ambulatory Visit: Payer: Self-pay

## 2018-12-07 VITALS — BP 120/80 | HR 74 | Temp 97.8°F | Ht 70.0 in | Wt 211.4 lb

## 2018-12-07 DIAGNOSIS — R1012 Left upper quadrant pain: Secondary | ICD-10-CM | POA: Diagnosis not present

## 2018-12-07 DIAGNOSIS — E041 Nontoxic single thyroid nodule: Secondary | ICD-10-CM | POA: Diagnosis not present

## 2018-12-07 DIAGNOSIS — R1310 Dysphagia, unspecified: Secondary | ICD-10-CM

## 2018-12-07 LAB — URINALYSIS, ROUTINE W REFLEX MICROSCOPIC
Bilirubin Urine: NEGATIVE
Ketones, ur: NEGATIVE
Leukocytes,Ua: NEGATIVE
Nitrite: NEGATIVE
Specific Gravity, Urine: 1.02 (ref 1.000–1.030)
Total Protein, Urine: NEGATIVE
Urine Glucose: NEGATIVE
Urobilinogen, UA: 0.2 (ref 0.0–1.0)
pH: 5 (ref 5.0–8.0)

## 2018-12-07 LAB — CBC WITH DIFFERENTIAL/PLATELET
Basophils Absolute: 0 10*3/uL (ref 0.0–0.1)
Basophils Relative: 0.3 % (ref 0.0–3.0)
Eosinophils Absolute: 0.4 10*3/uL (ref 0.0–0.7)
Eosinophils Relative: 4 % (ref 0.0–5.0)
HCT: 41.3 % (ref 36.0–46.0)
Hemoglobin: 13.8 g/dL (ref 12.0–15.0)
Lymphocytes Relative: 31.6 % (ref 12.0–46.0)
Lymphs Abs: 3.5 10*3/uL (ref 0.7–4.0)
MCHC: 33.3 g/dL (ref 30.0–36.0)
MCV: 79.8 fl (ref 78.0–100.0)
Monocytes Absolute: 0.6 10*3/uL (ref 0.1–1.0)
Monocytes Relative: 5.3 % (ref 3.0–12.0)
Neutro Abs: 6.4 10*3/uL (ref 1.4–7.7)
Neutrophils Relative %: 58.8 % (ref 43.0–77.0)
Platelets: 281 10*3/uL (ref 150.0–400.0)
RBC: 5.18 Mil/uL — ABNORMAL HIGH (ref 3.87–5.11)
RDW: 14 % (ref 11.5–15.5)
WBC: 11 10*3/uL — ABNORMAL HIGH (ref 4.0–10.5)

## 2018-12-07 LAB — COMPREHENSIVE METABOLIC PANEL
ALT: 15 U/L (ref 0–35)
AST: 14 U/L (ref 0–37)
Albumin: 4.5 g/dL (ref 3.5–5.2)
Alkaline Phosphatase: 53 U/L (ref 39–117)
BUN: 13 mg/dL (ref 6–23)
CO2: 26 mEq/L (ref 19–32)
Calcium: 9.9 mg/dL (ref 8.4–10.5)
Chloride: 102 mEq/L (ref 96–112)
Creatinine, Ser: 0.75 mg/dL (ref 0.40–1.20)
GFR: 82.27 mL/min (ref >60.00)
Glucose, Bld: 85 mg/dL (ref 70–99)
Potassium: 4.3 mEq/L (ref 3.5–5.1)
Sodium: 138 mEq/L (ref 135–145)
Total Bilirubin: 0.3 mg/dL (ref 0.2–1.2)
Total Protein: 7.1 g/dL (ref 6.0–8.3)

## 2018-12-07 LAB — TSH: TSH: 1.81 u[IU]/mL (ref 0.35–4.50)

## 2018-12-07 LAB — AMYLASE: Amylase: 30 U/L (ref 27–131)

## 2018-12-07 LAB — T4, FREE: Free T4: 0.83 ng/dL (ref 0.60–1.60)

## 2018-12-07 LAB — T3, FREE: T3, Free: 3.5 pg/mL (ref 2.3–4.2)

## 2018-12-07 LAB — LIPASE: Lipase: 24 U/L (ref 11.0–59.0)

## 2018-12-07 NOTE — Progress Notes (Signed)
Willey Blade DOB: 01-12-1971 Encounter date: 12/07/2018  This is a 48 y.o. female who presents for complete physical; but we are going to address some acute issues today that she has been having.   History of present illness/Additional concerns:  Worried about thyroid. Has had biopsy that was negative, but food is getting stuck. Not terrible, not spitting it up. Having to push things down and drinking between bites. Just bothering her.   Having left side pain. Has had this before, but different. Has adhesions, but this is different. Radiates to back, sharp pains LUQ. Almost went to ER with it. Sometimes pain is a little in hip or thigh. Aching a lot. Wakes her; concerns her. Can't feel anything there or pinpoint spot. Bowels are moving regularly. Low level nausea sometimes, no vomiting. No diarrhea, no blood in stools. Feels like abdomen is full on that side.   Last time discussed muscle twitching. Referral was closed due to mailbox being full. Still twitches; didn't get worse.   Saw gyn in Jan; had pap. This was ok. Is skipping periods. Sometimes has night sweats. Not having hot flashes during the day.   Benign finding on US/mamm; repeat in 02/2019  No sore throat. Acid reflux if she eats the wrong foods. Goes away with tums.    Had colonoscopy about 15 years ago- due to rectal bleeding. Dx. With hemorrhoids.    Past Medical History:  Diagnosis Date  . Abdominal pain, RLQ 10/22/2012  . Hemorrhagic ovarian cyst 10/23/2012  . PONV (postoperative nausea and vomiting)   . S/P endometrial ablation   . S/P tubal ligation    Past Surgical History:  Procedure Laterality Date  . CESAREAN SECTION     x3  . INCISION AND DRAINAGE BREAST ABSCESS  2000  . TUBAL LIGATION     Allergies  Allergen Reactions  . Erythromycin Other (See Comments)    Unknown, was told by mother  . Hydrocodone     Nausea and vomiting   Current Meds  Medication Sig  . acetaminophen (TYLENOL) 500 MG tablet  Take 500 mg by mouth every 6 (six) hours as needed.  Marland Kitchen ibuprofen (ADVIL,MOTRIN) 200 MG tablet Take 600 mg by mouth every 6 (six) hours as needed.  . meclizine (ANTIVERT) 25 MG tablet Take 1 tablet (25 mg total) by mouth every 4 (four) hours as needed for dizziness.  . Multiple Vitamin (MULTIVITAMIN WITH MINERALS) TABS tablet Take 1 tablet by mouth daily.   Social History   Tobacco Use  . Smoking status: Never Smoker  . Smokeless tobacco: Never Used  Substance Use Topics  . Alcohol use: No   Family History  Problem Relation Age of Onset  . Hyperlipidemia Mother   . Diabetes Mother   . CAD Mother 63  . Heart attack Mother 67       previous stent re-occluded  . Hyperlipidemia Father   . Bladder Cancer Father        tobacco abuse  . Colon polyps Father   . Epilepsy Father   . Cancer Paternal Grandmother        colon?  . Other Sister        hyperglycemia  . High Cholesterol Sister   . Epilepsy Brother   . Melanoma Maternal Grandmother 84  . Other Maternal Grandfather        no family history known  . Other Paternal Grandfather        car accident  . Alcohol abuse Brother  esoph varices     Review of Systems  Constitutional: Negative for chills, fatigue and fever.  Respiratory: Negative for cough, chest tightness, shortness of breath and wheezing.   Cardiovascular: Negative for chest pain, palpitations and leg swelling.  Gastrointestinal: Positive for abdominal distention, abdominal pain and nausea. Negative for blood in stool, constipation, diarrhea and vomiting.  Genitourinary: Positive for flank pain. Negative for difficulty urinating, dysuria and frequency.    CBC:  Lab Results  Component Value Date   WBC 11.0 (H) 12/07/2018   HGB 13.8 12/07/2018   HCT 41.3 12/07/2018   MCH 26.1 10/23/2012   MCHC 33.3 12/07/2018   RDW 14.0 12/07/2018   PLT 281.0 12/07/2018   CMP: Lab Results  Component Value Date   NA 138 12/07/2018   K 4.3 12/07/2018   CL 102  12/07/2018   CO2 26 12/07/2018   GLUCOSE 85 12/07/2018   BUN 13 12/07/2018   CREATININE 0.75 12/07/2018   GFRAA >90 10/22/2012   CALCIUM 9.9 12/07/2018   PROT 7.1 12/07/2018   BILITOT 0.3 12/07/2018   ALKPHOS 53 12/07/2018   ALT 15 12/07/2018   AST 14 12/07/2018   LIPID: Lab Results  Component Value Date   CHOL 164 10/28/2017   TRIG 111.0 10/28/2017   HDL 31.00 (L) 10/28/2017   LDLCALC 111 (H) 10/28/2017    Objective:  BP 120/80 (BP Location: Left Arm, Patient Position: Sitting, Cuff Size: Large)   Pulse 74   Temp 97.8 F (36.6 C) (Temporal)   Ht 5\' 10"  (1.778 m)   Wt 211 lb 6.4 oz (95.9 kg)   LMP 10/23/2018 (Approximate)   SpO2 98%   BMI 30.33 kg/m   Weight: 211 lb 6.4 oz (95.9 kg)   BP Readings from Last 3 Encounters:  12/07/18 120/80  10/24/17 122/60  10/08/17 112/76   Wt Readings from Last 3 Encounters:  12/07/18 211 lb 6.4 oz (95.9 kg)  10/24/17 208 lb 6.4 oz (94.5 kg)  10/08/17 213 lb 1.6 oz (96.7 kg)    Physical Exam Constitutional:      General: She is not in acute distress.    Appearance: She is well-developed.  Cardiovascular:     Rate and Rhythm: Normal rate and regular rhythm.     Heart sounds: Normal heart sounds. No murmur. No friction rub.  Pulmonary:     Effort: Pulmonary effort is normal. No respiratory distress.     Breath sounds: Normal breath sounds. No wheezing or rales.  Abdominal:     General: Abdomen is flat. Bowel sounds are normal.     Palpations: Abdomen is soft.     Tenderness: There is abdominal tenderness. There is guarding and rebound. There is no left CVA tenderness. Negative signs include Murphy's sign and McBurney's sign.       Comments: She has upper left tenderness to palpation.  There is some tenderness along the inferior rib border left side.  She is also quite tender to the left sided abdomen.  There does appear to be some degree of abdominal wall tenderness with palpation as she has increased pain with abdominal  wall flexion, but tenderness is fairly significant and somewhat hard to discern as she is uncomfortable with palpation.  Musculoskeletal:     Right lower leg: No edema.     Left lower leg: No edema.  Neurological:     Mental Status: She is alert and oriented to person, place, and time.  Psychiatric:  Behavior: Behavior normal.     Assessment/Plan: Health Maintenance Due  Topic Date Due  . MAMMOGRAM  08/20/2018   1. Thyroid nodule Repeat ultrasound to reevaluate thyroid. - US THYROID; Future - TSH; Future - T4, free; Future - T3, free; Future - T3, free - T4, free - TSH  2. Left upper quadrant abdominal pain Uncertain etiology.  We will start with blood work and consider further imaging pending these results. - CBC with Differential/Platelet; Future - Comprehensive metabolic panel; Future - Lipase; Future - Amylase; Future - Urinalysis; Future - Urinalysis - Amylase - Lipase - Comprehensive metabolic panel - CBC with Differential/Platelet  3. Dysphagia, unspecified type Likely will need to see GI specialist, but we will get thyroid ultrasound and blood work first.  Return for pending lab results.  Micheline Rough, MD

## 2018-12-08 ENCOUNTER — Other Ambulatory Visit: Payer: Self-pay | Admitting: Family Medicine

## 2018-12-08 ENCOUNTER — Encounter: Payer: Self-pay | Admitting: Family Medicine

## 2018-12-08 DIAGNOSIS — R1084 Generalized abdominal pain: Secondary | ICD-10-CM

## 2018-12-19 ENCOUNTER — Other Ambulatory Visit: Payer: Self-pay | Admitting: *Deleted

## 2018-12-19 DIAGNOSIS — Z20822 Contact with and (suspected) exposure to covid-19: Secondary | ICD-10-CM

## 2018-12-19 DIAGNOSIS — Z20828 Contact with and (suspected) exposure to other viral communicable diseases: Secondary | ICD-10-CM

## 2018-12-22 LAB — NOVEL CORONAVIRUS, NAA: SARS-CoV-2, NAA: NOT DETECTED

## 2018-12-24 ENCOUNTER — Ambulatory Visit
Admission: RE | Admit: 2018-12-24 | Discharge: 2018-12-24 | Disposition: A | Discharge status: Home / Self Care | Payer: 59 | Source: Ambulatory Visit | Attending: Family Medicine | Admitting: Family Medicine

## 2018-12-24 DIAGNOSIS — E041 Nontoxic single thyroid nodule: Secondary | ICD-10-CM | POA: Diagnosis not present

## 2018-12-24 DIAGNOSIS — R1032 Left lower quadrant pain: Secondary | ICD-10-CM | POA: Diagnosis not present

## 2018-12-24 DIAGNOSIS — R1084 Generalized abdominal pain: Secondary | ICD-10-CM

## 2018-12-24 MED ORDER — IOPAMIDOL (ISOVUE-300) INJECTION 61%
100.0000 mL | Freq: Once | INTRAVENOUS | Status: AC | PRN
  Administered 2018-12-24: 100 mL via INTRAVENOUS

## 2018-12-28 ENCOUNTER — Encounter: Payer: Self-pay | Admitting: Gastroenterology

## 2018-12-28 ENCOUNTER — Other Ambulatory Visit: Payer: Self-pay | Admitting: *Deleted

## 2018-12-28 DIAGNOSIS — R1310 Dysphagia, unspecified: Secondary | ICD-10-CM

## 2018-12-28 DIAGNOSIS — R300 Dysuria: Secondary | ICD-10-CM

## 2018-12-28 DIAGNOSIS — R1084 Generalized abdominal pain: Secondary | ICD-10-CM

## 2019-01-05 ENCOUNTER — Ambulatory Visit: Payer: Self-pay

## 2019-01-05 DIAGNOSIS — E041 Nontoxic single thyroid nodule: Secondary | ICD-10-CM

## 2019-01-05 NOTE — Telephone Encounter (Signed)
Provided Notes  Of Dr.  Ethlyn Gallery 12/30/18. Patient voiced understanding.  Patient states that she would refer [place] to referral.  Does not want biopsy.  If its going to be a long time before appointment she said let her know.

## 2019-01-06 ENCOUNTER — Other Ambulatory Visit: Payer: Self-pay

## 2019-01-06 ENCOUNTER — Telehealth (INDEPENDENT_AMBULATORY_CARE_PROVIDER_SITE_OTHER): Payer: 59 | Admitting: Family Medicine

## 2019-01-06 DIAGNOSIS — R300 Dysuria: Secondary | ICD-10-CM | POA: Diagnosis not present

## 2019-01-06 LAB — POCT URINALYSIS DIPSTICK
Bilirubin, UA: NEGATIVE
Glucose, UA: NEGATIVE
Ketones, UA: NEGATIVE
Leukocytes, UA: NEGATIVE
Nitrite, UA: NEGATIVE
Protein, UA: NEGATIVE
Spec Grav, UA: 1.025 (ref 1.010–1.025)
Urobilinogen, UA: 0.2 E.U./dL
pH, UA: 5 (ref 5.0–8.0)

## 2019-01-06 NOTE — Telephone Encounter (Signed)
Referral to [facility] placed.

## 2019-01-06 NOTE — Progress Notes (Signed)
Virtual Visit via Video Note  I connected with Sydney Hale on 01/06/19 at  3:00 PM EST by a video enabled telemedicine application 2/2 [id number] pandemic and verified that I am speaking with the correct person using two identifiers.  Location patient: home Location provider:work or home office Persons participating in the virtual visit: patient, provider  I discussed the limitations of evaluation and management by telemedicine and the availability of in person appointments. The patient expressed understanding and agreed to proceed.   HPI: Pt is a 48 yo female followed by Dr. Ethlyn Gallery, seen for acute concern.  Pt with urinary frequency, decreased appetite 2/2 nausea,  L sided abdominal pain, suprapubic pain, dysuria, and urgency x a few days.  States symptoms feel like prior UTIs.  Pt denies HAs, fever, chills, emesis, pain that moves.  Pt notes several wks ago feeling like she had a UTI, was put on macrobid, but was not sure the symptom completely resolved.  Pt trying to increase po intake of water.  Of note: pt had a negative CT abdomen and pelvis on 12/24/18.  ROS: See pertinent positives and negatives per HPI.  Past Medical History:  Diagnosis Date  . Abdominal pain, RLQ 10/22/2012  . Hemorrhagic ovarian cyst 10/23/2012  . PONV (postoperative nausea and vomiting)   . S/P endometrial ablation   . S/P tubal ligation     Past Surgical History:  Procedure Laterality Date  . CESAREAN SECTION     x3  . INCISION AND DRAINAGE BREAST ABSCESS  2000  . TUBAL LIGATION      Family History  Problem Relation Age of Onset  . Hyperlipidemia Mother   . Diabetes Mother   . CAD Mother 82  . Heart attack Mother 15       previous stent re-occluded  . Hyperlipidemia Father   . Bladder Cancer Father        tobacco abuse  . Colon polyps Father   . Epilepsy Father   . Cancer Paternal Grandmother        colon?  . Other Sister        hyperglycemia  . High Cholesterol Sister   . Epilepsy  Brother   . Melanoma Maternal Grandmother 80  . Other Maternal Grandfather        no family history known  . Other Paternal Grandfather        car accident  . Alcohol abuse Brother        esoph varices    Current Outpatient Medications:  .  acetaminophen (TYLENOL) 500 MG tablet, Take 500 mg by mouth every 6 (six) hours as needed., Disp: , Rfl:  .  ibuprofen (ADVIL,MOTRIN) 200 MG tablet, Take 600 mg by mouth every 6 (six) hours as needed., Disp: , Rfl:  .  meclizine (ANTIVERT) 25 MG tablet, Take 1 tablet (25 mg total) by mouth every 4 (four) hours as needed for dizziness., Disp: 60 tablet, Rfl: 0 .  Multiple Vitamin (MULTIVITAMIN WITH MINERALS) TABS tablet, Take 1 tablet by mouth daily., Disp: , Rfl:   EXAM:  VITALS per patient if applicable:  GENERAL: alert, oriented, appears well and in no acute distress  HEENT: atraumatic, conjunctiva clear, no obvious abnormalities on inspection of external nose and ears  NECK: normal movements of the head and neck  LUNGS: on inspection no signs of respiratory distress, breathing rate appears normal, no obvious gross SOB, gasping or wheezing  CV: no obvious cyanosis  MS: moves all visible extremities without noticeable  abnormality  PSYCH/NEURO: pleasant and cooperative, no obvious depression or anxiety, speech and thought processing grossly intact  ASSESSMENT AND PLAN:  Discussed the following assessment and plan:  Dysuria  -discussed possible causes including UTI, pyelonephritis, renal calculi-though CT abd/pelvis w/ contrast normal 12/24/18. -Pt gave urine sample earlier today. -UA with 1+ RBCs, SG 1.025 -will send for UCx -pt to increase intake of water and fluids.  Wishes to wait for Cx results prior to starting abx. -given precautions for worsening symptoms while waiting on cx. - Plan: POC Urinalysis Dipstick, Urine culture  F/u prn   I discussed the assessment and treatment plan with the patient. The patient was provided an  opportunity to ask questions and all were answered. The patient agreed with the plan and demonstrated an understanding of the instructions.   The patient was advised to call back or seek an in-person evaluation if the symptoms worsen or if the condition fails to improve as anticipated.   Billie Ruddy, MD

## 2019-01-08 LAB — URINE CULTURE
MICRO NUMBER:: 1155655
SPECIMEN QUALITY:: ADEQUATE

## 2019-01-09 ENCOUNTER — Other Ambulatory Visit: Payer: Self-pay | Admitting: Family Medicine

## 2019-01-09 DIAGNOSIS — N3001 Acute cystitis with hematuria: Secondary | ICD-10-CM

## 2019-01-09 MED ORDER — AMOXICILLIN-POT CLAVULANATE 500-125 MG PO TABS: 1.0000 | ORAL_TABLET | Freq: Two times a day (BID) | ORAL | 0 refills | Status: AC

## 2019-01-18 ENCOUNTER — Ambulatory Visit (INDEPENDENT_AMBULATORY_CARE_PROVIDER_SITE_OTHER): Payer: 59 | Admitting: Gastroenterology

## 2019-01-18 ENCOUNTER — Encounter: Payer: Self-pay | Admitting: Gastroenterology

## 2019-01-18 ENCOUNTER — Other Ambulatory Visit: Payer: Self-pay

## 2019-01-18 VITALS — BP 118/80 | HR 101 | Temp 98.6°F | Ht 70.0 in | Wt 212.5 lb

## 2019-01-18 DIAGNOSIS — R1310 Dysphagia, unspecified: Secondary | ICD-10-CM | POA: Diagnosis not present

## 2019-01-18 DIAGNOSIS — K219 Gastro-esophageal reflux disease without esophagitis: Secondary | ICD-10-CM

## 2019-01-18 MED ORDER — PANTOPRAZOLE SODIUM 40 MG PO TBEC: 40.0000 mg | DELAYED_RELEASE_TABLET | Freq: Every day | ORAL | 0 refills | Status: DC

## 2019-01-18 MED ORDER — CLENPIQ 10-3.5-12 MG-GM -GM/160ML PO SOLN: 1.0000 | Freq: Once | ORAL | 0 refills | Status: AC

## 2019-01-18 NOTE — Progress Notes (Signed)
Chief Complaint: Abdominal pain, dysphagia.  Referring Provider:  Caren Macadam, MD      ASSESSMENT AND PLAN;   #1.  LLQ pain. Neg CT AP 12/2018. H/O rectal bleeding is concerning.  #2. GERD with dysphagia. Has multinodular goiter (being followed by endocrine)   Plan: - Protonix 40mg  po qd x 4 weeks - Ba swallow with Ba tab - EGD with possible dilatation and colon (2 day prep).  Explained risks and benefits.  She wishes to proceed. - Heating pads/Biofreeze to take care of musculoskeletal component.   HPI:    Sydney Hale is a 48 y.o. female  RN with hospice care LMQ/LLQ pain, with associated back pain x 2-3 months BMs 1/day Blood in stool -mostly bright red x over a year.  No straining with bowel movements.  No definite diarrhea or constipation. Could not identify any definite exacerbating or relieving factors for abdominal pain.  Has been having problems with dysphagia mostly to solids, lower neck area x 1 year.  More so with meats, pills.  Denies having any odynophagia. Dx with multinodular goiter based upon ultrasound.  She is under care of endocrine.  Has been having occasional heartburn.  Underwent CT Abdo/pelvis on 12/24/2018 which was unremarkable except for old vertebral injury. She did have redundant colon with some constipation.  No acute abnormalities.   mon- dad with microscpic colitis.  No colon cancer or colonic polyps.  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  Unfortunately one child has passed away.  Had baclofen pump. Past Medical History:  Diagnosis Date  . Abdominal pain, RLQ 10/22/2012  . Hemorrhagic ovarian cyst 10/23/2012  . PONV (postoperative nausea and vomiting)   . S/P endometrial ablation   . S/P tubal ligation   . UTI (urinary tract infection)     Past Surgical History:  Procedure Laterality Date  . CESAREAN SECTION     x3  . COLONOSCOPY     around 2008. Was having rectal bleeding. In [place]  . INCISION AND  DRAINAGE BREAST ABSCESS  2000  . TUBAL LIGATION      Family History  Problem Relation Age of Onset  . Hyperlipidemia Mother   . Diabetes Mother   . CAD Mother 21  . Heart attack Mother 81       previous stent re-occluded  . Hyperlipidemia Father   . Bladder Cancer Father        tobacco abuse  . Colon polyps Father   . Epilepsy Father   . Cancer Paternal Grandmother        colon?  . Other Sister        hyperglycemia  . High Cholesterol Sister   . Epilepsy Brother   . Melanoma Maternal Grandmother 18  . Other Maternal Grandfather        no family history known  . Other Paternal Grandfather        car accident  . Alcohol abuse Brother        esoph varices  . Esophageal cancer Neg Hx     Social History   Tobacco Use  . Smoking status: Never Smoker  . Smokeless tobacco: Never Used  Substance Use Topics  . Alcohol use: Yes    Comment: ocassionally  . Drug use: No    Current Outpatient Medications  Medication Sig Dispense Refill  . acetaminophen (TYLENOL) 500 MG tablet Take 500 mg by mouth every 6 (six) hours as needed.    Marland Kitchen CLODERM  0.1 % cream Apply 1 application topically as needed.     Marland Kitchen ibuprofen (ADVIL,MOTRIN) 200 MG tablet Take 600 mg by mouth every 6 (six) hours as needed.    . Multiple Vitamin (MULTIVITAMIN WITH MINERALS) TABS tablet Take 1 tablet by mouth daily.    . meclizine (ANTIVERT) 25 MG tablet Take 1 tablet (25 mg total) by mouth every 4 (four) hours as needed for dizziness. (Patient not taking: Reported on 01/18/2019) 60 tablet 0   No current facility-administered medications for this visit.    Allergies  Allergen Reactions  . Erythromycin Other (See Comments)    Unknown, was told by mother  . Hydrocodone     Nausea and vomiting    Review of Systems:  Constitutional: Denies fever, chills, diaphoresis, appetite change and fatigue.  HEENT: Denies photophobia, eye pain, redness, hearing loss, ear pain, congestion, sore throat, rhinorrhea,  sneezing, mouth sores, neck pain, neck stiffness and tinnitus.   Respiratory: Denies SOB, DOE, cough, chest tightness,  and wheezing.   Cardiovascular: Denies chest pain, palpitations and leg swelling.  Genitourinary: Denies dysuria, urgency, frequency, hematuria, flank pain and difficulty urinating.  Musculoskeletal: Denies myalgias, back pain, joint swelling, arthralgias and gait problem.  Skin: No rash.  Neurological: Denies dizziness, seizures, syncope, weakness, light-headedness, numbness and headaches.  Hematological: Denies adenopathy. Easy bruising, personal or family bleeding history  Psychiatric/Behavioral: Has anxiety or depression     Physical Exam:    BP 118/80   Pulse (!) 101   Temp 98.6 F (37 C)   Ht 5\' 10"  (1.778 m)   Wt 212 lb 8 oz (96.4 kg)   BMI 30.49 kg/m  Wt Readings from Last 3 Encounters:  01/18/19 212 lb 8 oz (96.4 kg)  12/07/18 211 lb 6.4 oz (95.9 kg)  10/24/17 208 lb 6.4 oz (94.5 kg)   Constitutional:  Well-developed, in no acute distress. Psychiatric: Normal mood and affect. Behavior is normal. HEENT: Pupils normal.  Conjunctivae are normal. No scleral icterus. Neck supple.  Cardiovascular: Normal rate, regular rhythm. No edema Pulmonary/chest: Effort normal and breath sounds normal. No wheezing, rales or rhonchi. Abdominal: Soft, nondistended.  Mild left lower quad abdominal tenderness.  Bowel sounds are present.  Bowel sounds active throughout. There are no masses palpable. No hepatomegaly. Rectal:  defered Neurological: Alert and oriented to person place and time. Skin: Skin is warm and dry. No rashes noted.  Data Reviewed: I have personally reviewed following labs and imaging studies  CBC: CBC Latest Ref Rng & Units 12/07/2018 10/28/2017 07/03/2016  WBC 4.0 - 10.5 K/uL 11.0(H) 6.7 10.7(H)  Hemoglobin 12.0 - 15.0 g/dL 13.8 13.2 12.5  Hematocrit 36.0 - 46.0 % 41.3 39.8 37.8  Platelets 150.0 - 400.0 K/uL 281.0 269.0 295.0    CMP: CMP Latest  Ref Rng & Units 12/07/2018 10/28/2017 07/03/2016  Glucose 70 - 99 mg/dL 85 100(H) 128(H)  BUN 6 - 23 mg/dL 13 15 13   Creatinine 0.40 - 1.20 mg/dL 0.75 0.86 0.83  Sodium 135 - 145 mEq/L 138 138 139  Potassium 3.5 - 5.1 mEq/L 4.3 3.9 3.7  Chloride 96 - 112 mEq/L 102 105 106  CO2 19 - 32 mEq/L 26 26 25   Calcium 8.4 - 10.5 mg/dL 9.9 9.7 9.6  Total Protein 6.0 - 8.3 g/dL 7.1 7.0 -  Total Bilirubin 0.2 - 1.2 mg/dL 0.3 0.5 -  Alkaline Phos 39 - 117 U/L 53 40 -  AST 0 - 37 U/L 14 13 -  ALT 0 -  35 U/L 15 16 -       Radiology Studies: CT ABDOMEN PELVIS W CONTRAST  Result Date: 12/24/2018 CLINICAL DATA:  Left lower quadrant abdominal pain.  Nausea. EXAM: CT ABDOMEN AND PELVIS WITH CONTRAST TECHNIQUE: Multidetector CT imaging of the abdomen and pelvis was performed using the standard protocol following bolus administration of intravenous contrast. CONTRAST:  172mL ISOVUE-300 IOPAMIDOL (ISOVUE-300) INJECTION 61% COMPARISON:  CT abdomen pelvis 10/22/2012. FINDINGS: Lower chest: Normal heart size. Minimal dependent atelectasis within the bilateral lower lobes. No pleural effusion. Hepatobiliary: The liver is normal in size and contour. No focal hepatic lesion is identified. Gallbladder is unremarkable. No intrahepatic or extrahepatic biliary ductal dilatation. Pancreas: Unremarkable Spleen: Unremarkable Adrenals/Urinary Tract: Normal adrenal glands. Kidneys are symmetric in size. No hydronephrosis. Urinary bladder is unremarkable. Stomach/Bowel: Normal morphology of the stomach. No evidence for small bowel obstruction. No free fluid or free intraperitoneal air. Vascular/Lymphatic: Normal caliber abdominal aorta. No retroperitoneal lymphadenopathy. Reproductive: Unremarkable. Other: None. Musculoskeletal: No aggressive or acute appearing osseous lesions. IMPRESSION: No acute process within the abdomen or pelvis. Electronically Signed   By: Lovey Newcomer M.D.   On: 12/24/2018 18:07   US THYROID  Result Date:  12/25/2018 CLINICAL DATA:  48 year old female with difficulty swallowing EXAM: THYROID ULTRASOUND TECHNIQUE: Ultrasound examination of the thyroid gland and adjacent soft tissues was performed. COMPARISON:  04/28/2015 FINDINGS: Parenchymal Echotexture: Mildly heterogenous Isthmus: 0.3 cm Right lobe: 4.6 cm x 1.8 cm x 1.7 cm Left lobe: 3.7 cm x 1.3 cm x 1.4 cm _________________________________________________________ Estimated total number of nodules >/= 1 cm: 3 Number of spongiform nodules >/=  2 cm not described below (TR1): 0 Number of mixed cystic and solid nodules >/= 1.5 cm not described below ([place]): 0 _________________________________________________________ Nodule # 1: Location: Right; Superior Maximum size: 1.45 cm; Other 2 dimensions: 1.4 cm x 1.0 cm Composition: cannot determine (2) Echogenicity: hypoechoic (2) Shape: not taller-than-wide (0) Margins: ill-defined (0) Echogenic foci: none (0) ACR TI-RADS total points: 4. ACR TI-RADS risk category: TR4 (4-6 points). ACR TI-RADS recommendations: Nodule meets criteria for surveillance _________________________________________________________ Nodule # 2: Location: Right; Mid Maximum size: 1.4 cm; Other 2 dimensions: 0.9 cm x 0.7 cm Composition: spongiform (0) ACR TI-RADS recommendations: Spongiform nodule does not meet criteria for surveillance or biopsy _________________________________________________________ Nodule # 3: Location: Right; Inferior Maximum size: 0.8 cm; Other 2 dimensions: 0.7 cm x 0.5 cm Composition: spongiform (0) ACR TI-RADS recommendations: Spongiform nodule does not meet criteria for surveillance or biopsy _________________________________________________________ Nodule # 4: Location: Right; Inferior Maximum size: 1.6 cm; Other 2 dimensions: 1.5 cm x 1.2 cm Composition: cannot determine (2) Echogenicity: hypoechoic (2) Shape: not taller-than-wide (0) Margins: extra-thyroidal extension (3) Echogenic foci: none (0) ACR TI-RADS total points:  7. ACR TI-RADS risk category: TR5 (>/= 7 points). ACR TI-RADS recommendations: Nodule meets criteria for biopsy _________________________________________________________ No adenopathy IMPRESSION: Multinodular thyroid. Right inferior thyroid nodule (labeled 4, 1.6 cm, extrathyroid extension) meets criteria for biopsy, as designated by the newly established ACR TI-RADS criteria, and referral for biopsy is recommended. Right superior thyroid nodule (labeled 1) meets criteria for surveillance, as designated by the newly established ACR TI-RADS criteria. Surveillance ultrasound study recommended to be performed annually up to 5 years. Recommendations follow those established by the new ACR TI-RADS criteria (J Am Coll Radiol A9880051). Electronically Signed   By: Corrie Mckusick D.O.   On: 12/25/2018 07:08  CT scan was reviewed independently and with the patient.    Carmell Austria, MD 01/18/2019, 9:01  AM  Cc: Caren Macadam, MD

## 2019-01-18 NOTE — Patient Instructions (Signed)
If you are age 48 or older, your body mass index should be between 23-30. Your Body mass index is 30.49 kg/m. If this is out of the aforementioned range listed, please consider follow up with your Primary Care Provider.  If you are age 11 or younger, your body mass index should be between 19-25. Your Body mass index is 30.49 kg/m. If this is out of the aformentioned range listed, please consider follow up with your Primary Care Provider.   You have been scheduled for an endoscopy and colonoscopy. Please follow the written instructions given to you at your visit today. Please pick up your prep supplies at the pharmacy within the next 1-3 days. If you use inhalers (even only as needed), please bring them with you on the day of your procedure. Your physician has requested that you go to www.startemmi.com and enter the access code given to you at your visit today. This web site gives a general overview about your procedure. However, you should still follow specific instructions given to you by our office regarding your preparation for the procedure.  We have sent the following medications to your pharmacy for you to pick up at your convenience: Clenpiq Protonix  Two days before your procedure: Mix 3 packs (or capfuls) of Miralax in 48 ounces of clear liquid and drink at 6pm.  Use heating pad at night and biofreeze in the morning.   You have been scheduled for a Barium Esophogram at [facility] (1st floor of the hospital) on 01/20/19 at 11am. Please arrive 15 minutes prior to your appointment for registration. Make certain not to have anything to eat or drink 3 hours prior to your test. If you need to reschedule for any reason, please contact radiology at [phone] to do so. __________________________________________________________________ A barium swallow is an examination that concentrates on views of the esophagus. This tends to be a double contrast exam (barium and two liquids  which, when combined, create a gas to distend the wall of the oesophagus) or single contrast (non-ionic iodine based). The study is usually tailored to your symptoms so a good history is essential. Attention is paid during the study to the form, structure and configuration of the esophagus, looking for functional disorders (such as aspiration, dysphagia, achalasia, motility and reflux) EXAMINATION You may be asked to change into a gown, depending on the type of swallow being performed. A radiologist and radiographer will perform the procedure. The radiologist will advise you of the type of contrast selected for your procedure and direct you during the exam. You will be asked to stand, sit or lie in several different positions and to hold a small amount of fluid in your mouth before being asked to swallow while the imaging is performed .In some instances you may be asked to swallow barium coated marshmallows to assess the motility of a solid food bolus. The exam can be recorded as a digital or video fluoroscopy procedure. POST PROCEDURE It will take 1-2 days for the barium to pass through your system. To facilitate this, it is important, unless otherwise directed, to increase your fluids for the next 24-48hrs and to resume your normal diet.  This test typically takes about 30 minutes to perform. __________________________________________________________________________________  Thank you,  Dr. Jackquline Denmark

## 2019-01-20 ENCOUNTER — Other Ambulatory Visit: Payer: Self-pay

## 2019-01-20 ENCOUNTER — Ambulatory Visit (HOSPITAL_COMMUNITY)
Admission: RE | Admit: 2019-01-20 | Discharge: 2019-01-20 | Disposition: A | Discharge status: Home / Self Care | Payer: 59 | Source: Ambulatory Visit | Attending: Gastroenterology | Admitting: Gastroenterology

## 2019-01-20 DIAGNOSIS — R1310 Dysphagia, unspecified: Secondary | ICD-10-CM | POA: Diagnosis not present

## 2019-01-20 DIAGNOSIS — K219 Gastro-esophageal reflux disease without esophagitis: Secondary | ICD-10-CM

## 2019-01-21 DIAGNOSIS — R05 Cough: Secondary | ICD-10-CM | POA: Diagnosis not present

## 2019-01-21 DIAGNOSIS — Z20828 Contact with and (suspected) exposure to other viral communicable diseases: Secondary | ICD-10-CM | POA: Diagnosis not present

## 2019-01-21 DIAGNOSIS — R519 Headache, unspecified: Secondary | ICD-10-CM | POA: Diagnosis not present

## 2019-01-25 ENCOUNTER — Encounter: Payer: Self-pay | Admitting: Gastroenterology

## 2019-01-25 DIAGNOSIS — Z20828 Contact with and (suspected) exposure to other viral communicable diseases: Secondary | ICD-10-CM | POA: Diagnosis not present

## 2019-01-26 ENCOUNTER — Other Ambulatory Visit: Payer: Self-pay

## 2019-01-26 ENCOUNTER — Ambulatory Visit (INDEPENDENT_AMBULATORY_CARE_PROVIDER_SITE_OTHER): Payer: 59 | Admitting: Internal Medicine

## 2019-01-26 ENCOUNTER — Encounter: Payer: Self-pay | Admitting: Internal Medicine

## 2019-01-26 VITALS — BP 128/78 | HR 78 | Temp 97.9°F | Ht 70.0 in | Wt 216.4 lb

## 2019-01-26 DIAGNOSIS — E042 Nontoxic multinodular goiter: Secondary | ICD-10-CM

## 2019-01-26 NOTE — Progress Notes (Signed)
Name: Sydney Hale  MRN/ DOB: [id number], [birth year]    Age/ Sex: 48 y.o., female    PCP: Caren Macadam, MD   Reason for Endocrinology Evaluation: MNG     Date of Initial Endocrinology Evaluation: 01/26/2019     HPI: [name] is a 48 y.o. female with unremarkable past medical history. The patient presented for initial endocrinology clinic visit on 01/26/2019 for consultative assistance with her MNG  Pt has been diagnosed with multinodular goiter in 2017 during workup for neck pain and swallowing issues. An ultrasound done at the time revealed multiple nodules . She is S/P bening FNA of the right lower thyroid nodule in 05/2015.    She presented to her PCP for a routine check up and was found to continue having issues with neck pain and a "catch " in her throat especially with pills which prompted a repeat ultrasound.   Her symptoms have described as mild, she has not noticed any local enlargement at the neck area over the years. She denies sob.   Has occasional heart burn , she was recently started on a protonix.    Her weight fluctuates She denies constipation or diarrhea. Denies depression or anxiety.   No  radiation exposure   Mother with hypothyroidism   She is a Merchandiser, retail.    HISTORY:  Past Medical History:  Past Medical History:  Diagnosis Date  . Abdominal pain, RLQ 10/22/2012  . Hemorrhagic ovarian cyst 10/23/2012  . PONV (postoperative nausea and vomiting)   . S/P endometrial ablation   . S/P tubal ligation   . UTI (urinary tract infection)     Past Surgical History:  Past Surgical History:  Procedure Laterality Date  . CESAREAN SECTION     x3  . COLONOSCOPY     around 2008. Was having rectal bleeding. In [place]  . INCISION AND DRAINAGE BREAST ABSCESS  2000  . TUBAL LIGATION        Social History:  reports that she has never smoked. She has never used smokeless tobacco. She reports current alcohol use. She reports that she  does not use drugs.  Family History: family history includes Alcohol abuse in her brother; Bladder Cancer in her father; CAD (age of onset: 16) in her mother; Cancer in her paternal grandmother; Colon polyps in her father; Diabetes in her mother; Epilepsy in her brother and father; Heart attack (age of onset: 38) in her mother; High Cholesterol in her sister; Hyperlipidemia in her father and mother; Melanoma (age of onset: 69) in her maternal grandmother; Other in her maternal grandfather, paternal grandfather, and sister.   HOME MEDICATIONS: Allergies as of 01/26/2019      Reactions   Erythromycin Other (See Comments)   Unknown, was told by mother   Hydrocodone    Nausea and vomiting      Medication List       Accurate as of January 26, 2019 10:17 AM. If you have any questions, ask your nurse or doctor.        acetaminophen 500 MG tablet Commonly known as: TYLENOL Take 500 mg by mouth every 6 (six) hours as needed.   Cloderm 0.1 % cream Generic drug: Clocortolone Pivalate Apply 1 application topically as needed.   ibuprofen 200 MG tablet Commonly known as: ADVIL Take 600 mg by mouth every 6 (six) hours as needed.   meclizine 25 MG tablet Commonly known as: ANTIVERT Take 1 tablet (25 mg total) by  mouth every 4 (four) hours as needed for dizziness.   multivitamin with minerals Tabs tablet Take 1 tablet by mouth daily.   pantoprazole 40 MG tablet Commonly known as: PROTONIX Take 1 tablet (40 mg total) by mouth daily.         REVIEW OF SYSTEMS: A comprehensive ROS was conducted with the patient and is negative except as per HPI and below:  Review of Systems  Constitutional: Negative for fever and weight loss.  Respiratory: Negative for cough and shortness of breath.   Cardiovascular: Negative for chest pain and palpitations.  Gastrointestinal: Positive for heartburn. Negative for constipation and diarrhea.  Psychiatric/Behavioral: Negative for depression. The  patient is not nervous/anxious.        OBJECTIVE:  VS: BP 128/78 (BP Location: Left Arm, Patient Position: Sitting, Cuff Size: Large)   Pulse 78   Temp 97.9 F (36.6 C)   Ht 5\' 10"  (1.778 m)   Wt 216 lb 6.4 oz (98.2 kg)   SpO2 98%   BMI 31.05 kg/m    Wt Readings from Last 3 Encounters:  01/26/19 216 lb 6.4 oz (98.2 kg)  01/18/19 212 lb 8 oz (96.4 kg)  12/07/18 211 lb 6.4 oz (95.9 kg)     EXAM: General: Pt appears well and is in NAD  Hydration: Well-hydrated with moist mucous membranes and good skin turgor  Eyes: External eye exam normal without stare, lid lag or exophthalmos.  EOM intact.    Neck: General: Supple without adenopathy. Thyroid: Thyroid size normal.  No nodule appreciated.   Lungs: Clear with good BS bilat with no rales, rhonchi, or wheezes  Heart: Auscultation: RRR.  Abdomen: Normoactive bowel sounds, soft, nontender, without masses or organomegaly palpable  Extremities:  BL LE: No pretibial edema normal ROM and strength.  Neuro: Cranial nerves: II - XII grossly intact  Motor: Normal strength throughout DTRs: 2+ and symmetric in UE without delay in relaxation phase  Mental Status: Judgment, insight: Intact Mood and affect: No depression, anxiety, or agitation     DATA REVIEWED:  Results for LOENA, PORRITT (MRN [id number]) as of 01/26/2019 10:17  Ref. Range 12/07/2018 12:59  TSH Latest Ref Range: 0.35 - 4.50 uIU/mL 1.81  Triiodothyronine,Free,Serum Latest Ref Range: 2.3 - 4.2 pg/mL 3.5  T4,Free(Direct) Latest Ref Range: 0.60 - 1.60 ng/dL 0.83   Thyroid Ultrasound 12/24/2018  Nodule # 1:  Location: Right; Superior  Maximum size: 1.45 cm; Other 2 dimensions: 1.4 cm x 1.0 cm  Composition: cannot determine (2)  Echogenicity: hypoechoic (2)  Shape: not taller-than-wide (0)  Margins: ill-defined (0)  Echogenic foci: none (0)  ACR TI-RADS total points: 4.  ACR TI-RADS risk category: TR4 (4-6 points).  ACR TI-RADS  recommendations:  Nodule meets criteria for surveillance  _________________________________________________________  Nodule # 2:  Location: Right; Mid  Maximum size: 1.4 cm; Other 2 dimensions: 0.9 cm x 0.7 cm  Composition: spongiform (0)  ACR TI-RADS recommendations:  Spongiform nodule does not meet criteria for surveillance or biopsy  _________________________________________________________  Nodule # 3:  Location: Right; Inferior  Maximum size: 0.8 cm; Other 2 dimensions: 0.7 cm x 0.5 cm  Composition: spongiform (0)  ACR TI-RADS recommendations:  Spongiform nodule does not meet criteria for surveillance or biopsy  _________________________________________________________  Nodule # 4:  Location: Right; Inferior  Maximum size: 1.6 cm; Other 2 dimensions: 1.5 cm x 1.2 cm  Composition: cannot determine (2)  Echogenicity: hypoechoic (2)  Shape: not taller-than-wide (0)  Margins: extra-thyroidal extension (3)  Echogenic foci: none (0)  ACR TI-RADS total points: 7.  ACR TI-RADS risk category: TR5 (>/= 7 points).  ACR TI-RADS recommendations:  Nodule meets criteria for biopsy  _________________________________________________________  No adenopathy  IMPRESSION: Multinodular thyroid.  Right inferior thyroid nodule (labeled 4, 1.6 cm, extrathyroid extension) meets criteria for biopsy, as designated by the newly established ACR TI-RADS criteria, and referral for biopsy is recommended.  Right superior thyroid nodule (labeled 1) meets criteria for surveillance, as designated by the newly established ACR TI-RADS criteria. Surveillance ultrasound study recommended to be performed annually up to 5 years.    ASSESSMENT/PLAN/RECOMMENDATIONS:   1. MNG:   - Pt with minimal non-specific symptoms  - Clinically and biochemically euthyroid  - We have reviewed both ultrasound from 2017 and 2020. The nodule in question was  (trans 1.63x D 1.5x L 1.2) . Previously (2017) trans 1.2x D1.7 x L 1.00- this was measured on longitudinal view)   - The pt has a 20% increase in 2 dimensions and despite having a previously benign FNA , she meets the ATA criteria for repeat FNA.  - Pt agreed to the above.This case was also discussed with Dr. Vernard Gambles as Dr. Earleen Newport was not available at the time    F/U in 1 yr Signed electronically by: Mack Guise, MD  [facility]  [facility] [address] [place], [place] [address] Phone: [phone] FAX: [phone]   CC: Caren Macadam, [place] [address] Phone: [phone] Fax: [phone]   Return to Endocrinology clinic as below: Future Appointments  Date Time Provider [place]  02/03/2019 10:10 AM [facility] PREVISIT RM 68 [facility]  02/08/2019 11:00 AM Jackquline Denmark, MD [facility]

## 2019-01-28 ENCOUNTER — Encounter: Payer: Self-pay | Admitting: Internal Medicine

## 2019-01-28 DIAGNOSIS — E042 Nontoxic multinodular goiter: Secondary | ICD-10-CM | POA: Insufficient documentation

## 2019-02-03 ENCOUNTER — Ambulatory Visit (INDEPENDENT_AMBULATORY_CARE_PROVIDER_SITE_OTHER): Payer: 59

## 2019-02-03 ENCOUNTER — Ambulatory Visit: Payer: 59 | Admitting: Gastroenterology

## 2019-02-03 ENCOUNTER — Other Ambulatory Visit: Payer: Self-pay | Admitting: Gastroenterology

## 2019-02-03 DIAGNOSIS — Z1159 Encounter for screening for other viral diseases: Secondary | ICD-10-CM

## 2019-02-04 LAB — SARS CORONAVIRUS 2 (TAT 6-24 HRS): SARS Coronavirus 2: NEGATIVE

## 2019-02-08 ENCOUNTER — Other Ambulatory Visit: Payer: Self-pay

## 2019-02-08 ENCOUNTER — Encounter: Payer: Self-pay | Admitting: Gastroenterology

## 2019-02-08 ENCOUNTER — Ambulatory Visit (AMBULATORY_SURGERY_CENTER): Payer: 59 | Admitting: Gastroenterology

## 2019-02-08 VITALS — BP 102/70 | HR 74 | Temp 98.4°F | Resp 10 | Ht 70.0 in | Wt 212.0 lb

## 2019-02-08 DIAGNOSIS — R1032 Left lower quadrant pain: Secondary | ICD-10-CM | POA: Diagnosis not present

## 2019-02-08 DIAGNOSIS — K2950 Unspecified chronic gastritis without bleeding: Secondary | ICD-10-CM

## 2019-02-08 DIAGNOSIS — R1310 Dysphagia, unspecified: Secondary | ICD-10-CM | POA: Diagnosis not present

## 2019-02-08 DIAGNOSIS — K621 Rectal polyp: Secondary | ICD-10-CM | POA: Diagnosis not present

## 2019-02-08 DIAGNOSIS — K649 Unspecified hemorrhoids: Secondary | ICD-10-CM

## 2019-02-08 DIAGNOSIS — K648 Other hemorrhoids: Secondary | ICD-10-CM

## 2019-02-08 DIAGNOSIS — D128 Benign neoplasm of rectum: Secondary | ICD-10-CM

## 2019-02-08 MED ORDER — SODIUM CHLORIDE 0.9 % IV SOLN: 500.0000 mL | Freq: Once | INTRAVENOUS | Status: DC

## 2019-02-08 MED ORDER — HYDROCORTISONE (PERIANAL) 2.5 % EX CREA: TOPICAL_CREAM | CUTANEOUS | 2 refills | Status: DC

## 2019-02-08 NOTE — Op Note (Signed)
[place] Patient Name: Sydney Hale Procedure Date: 02/08/2019 10:34 AM MRN: [id number] Endoscopist: Jackquline Denmark , MD Age: 49 Referring MD:  Date of Birth: 1970/03/07 Gender: Female Account #: [id number] Procedure:                Colonoscopy Indications:              Rectal bleeding, LLQ pain with neg CT AP 2020 Medicines:                Monitored Anesthesia Care Procedure:                Pre-Anesthesia Assessment:                           - Prior to the procedure, a History and Physical                            was performed, and patient medications and                            allergies were reviewed. The patient's tolerance of                            previous anesthesia was also reviewed. The risks                            and benefits of the procedure and the sedation                            options and risks were discussed with the patient.                            All questions were answered, and informed consent                            was obtained. Prior Anticoagulants: The patient has                            taken no previous anticoagulant or antiplatelet                            agents. ASA Grade Assessment: II - A patient with                            mild systemic disease. After reviewing the risks                            and benefits, the patient was deemed in                            satisfactory condition to undergo the procedure.                           After obtaining informed consent, the colonoscope  was passed under direct vision. Throughout the                            procedure, the patient's blood pressure, pulse, and                            oxygen saturations were monitored continuously. The                            Colonoscope was introduced through the anus and                            advanced to the 2 cm into the ileum. The                            colonoscopy was performed  without difficulty. The                            patient tolerated the procedure well. The quality                            of the bowel preparation was adequate to identify                            polyps. The terminal ileum, ileocecal valve,                            appendiceal orifice, and rectum were photographed. Scope In: 10:54:23 AM Scope Out: 11:12:33 AM Scope Withdrawal Time: 0 hours 14 minutes 25 seconds  Total Procedure Duration: 0 hours 18 minutes 10 seconds  Findings:                 A 2 mm polyp was found in the rectum. The polyp was                            sessile. The polyp was removed with a cold biopsy                            forceps. Resection and retrieval were complete.                           Non-bleeding internal hemorrhoids were found during                            retroflexion and during perianal exam. The                            hemorrhoids were small. Some perianal excoriation                            was noted.                           The terminal ileum appeared normal.  The exam was otherwise without abnormality on                            direct and retroflexion views. The colon was highly                            redundant. Complications:            No immediate complications. Estimated Blood Loss:     Estimated blood loss: none. Impression:               -Diminutive colonic polyp s/p polypectomy.                           -Non-bleeding internal hemorrhoids.                           -Perianal excoriation.                           -Otherwise normal colonoscopy to TI. Recommendation:           - Patient has a contact number available for                            emergencies. The signs and symptoms of potential                            delayed complications were discussed with the                            patient. Return to normal activities tomorrow.                            Written discharge  instructions were provided to the                            patient.                           - Resume previous diet.                           - Continue present medications.                           - Await pathology results.                           - Repeat colonoscopy for surveillance based on                            pathology results.                           - HC 2.5% BID x 10 days-apply perianal area. Pl  give 2 refills. Keep the perianal area dry.                           - Return to GI clinic in 12 weeks.                           - D/W Merry Proud. Jackquline Denmark, MD 02/08/2019 11:24:48 AM This report has been signed electronically.

## 2019-02-08 NOTE — Progress Notes (Signed)
PT taken to PACU. Monitors in place. VSS. Report given to RN. 

## 2019-02-08 NOTE — Progress Notes (Signed)
Called to room to assist during endoscopic procedure.  Patient ID and intended procedure confirmed with present staff. Received instructions for my participation in the procedure from the performing physician.  

## 2019-02-08 NOTE — Patient Instructions (Signed)
Discharge instructions given. Prescription sent to pharmacy. Resume previous medications. No ibuprofen,naproxen,or other non-steroidal anti-inflammatory drugs for 5 days. YOU HAD AN ENDOSCOPIC PROCEDURE TODAY AT [facility]:   Refer to the procedure report that was given to you for any specific questions about what was found during the examination.  If the procedure report does not answer your questions, please call your gastroenterologist to clarify.  If you requested that your care partner not be given the details of your procedure findings, then the procedure report has been included in a sealed envelope for you to review at your convenience later.  YOU SHOULD EXPECT: Some feelings of bloating in the abdomen. Passage of more gas than usual.  Walking can help get rid of the air that was put into your GI tract during the procedure and reduce the bloating. If you had a lower endoscopy (such as a colonoscopy or flexible sigmoidoscopy) you may notice spotting of blood in your stool or on the toilet paper. If you underwent a bowel prep for your procedure, you may not have a normal bowel movement for a few days.  Please Note:  You might notice some irritation and congestion in your nose or some drainage.  This is from the oxygen used during your procedure.  There is no need for concern and it should clear up in a day or so.  SYMPTOMS TO REPORT IMMEDIATELY:   Following lower endoscopy (colonoscopy or flexible sigmoidoscopy):  Excessive amounts of blood in the stool  Significant tenderness or worsening of abdominal pains  Swelling of the abdomen that is new, acute  Fever of 100F or higher   Following upper endoscopy (EGD)  Vomiting of blood or coffee ground material  New chest pain or pain under the shoulder blades  Painful or persistently difficult swallowing  New shortness of breath  Fever of 100F or higher  Black, tarry-looking stools  For urgent or emergent issues, a  gastroenterologist can be reached at any hour by calling [phone].   DIET:  We do recommend a small meal at first, but then you may proceed to your regular diet.  Drink plenty of fluids but you should avoid alcoholic beverages for 24 hours.  ACTIVITY:  You should plan to take it easy for the rest of today and you should NOT DRIVE or use heavy machinery until tomorrow (because of the sedation medicines used during the test).    FOLLOW UP: Our staff will call the number listed on your records 48-72 hours following your procedure to check on you and address any questions or concerns that you may have regarding the information given to you following your procedure. If we do not reach you, we will leave a message.  We will attempt to reach you two times.  During this call, we will ask if you have developed any symptoms of COVID 19. If you develop any symptoms (ie: fever, flu-like symptoms, shortness of breath, cough etc.) before then, please call [phone].  If you test positive for Covid 19 in the 2 weeks post procedure, please call and report this information to [place].    If any biopsies were taken you will be contacted by phone or by letter within the next 1-3 weeks.  Please call us at [phone] if you have not heard about the biopsies in 3 weeks.    SIGNATURES/CONFIDENTIALITY: You and/or your care partner have signed paperwork which will be entered into your electronic medical record.  These signatures  attest to the fact that that the information above on your After Visit Summary has been reviewed and is understood.  Full responsibility of the confidentiality of this discharge information lies with you and/or your care-partner.

## 2019-02-08 NOTE — Op Note (Signed)
[place] Patient Name: Sydney Hale Procedure Date: 02/08/2019 10:35 AM MRN: [id number] Endoscopist: Jackquline Denmark , MD Age: 49 Referring MD:  Date of Birth: [birth year] Gender: Female Account #: [id number] Procedure:                Upper GI endoscopy Indications:              Dysphagia with neg Ba Swallow Medicines:                Monitored Anesthesia Care Procedure:                Pre-Anesthesia Assessment:                           - Prior to the procedure, a History and Physical                            was performed, and patient medications and                            allergies were reviewed. The patient's tolerance of                            previous anesthesia was also reviewed. The risks                            and benefits of the procedure and the sedation                            options and risks were discussed with the patient.                            All questions were answered, and informed consent                            was obtained. Prior Anticoagulants: The patient has                            taken no previous anticoagulant or antiplatelet                            agents. ASA Grade Assessment: II - A patient with                            mild systemic disease. After reviewing the risks                            and benefits, the patient was deemed in                            satisfactory condition to undergo the procedure.                           After obtaining informed consent, the endoscope was  passed under direct vision. Throughout the                            procedure, the patient's blood pressure, pulse, and                            oxygen saturations were monitored continuously. The                            Endoscope was introduced through the mouth, and                            advanced to the second part of duodenum. The upper                            GI endoscopy was  accomplished without difficulty.                            The patient tolerated the procedure well. Scope In: Scope Out: Findings:                 The esophagus was normal. Z-line was well-defined                            at 38 cm. Examined by NBI. Multiple biopsies were                            obtained from proximal, mid and distal esophagus to                            rule out eosinophilic esophagitis. It was decided,                            however, to proceed with dilation of the entire                            esophagus. The scope was withdrawn. Dilation was                            performed with a Maloney dilator with mild                            resistance at 50 Fr.                           Localized mild inflammation characterized by                            erythema was found in the antrum. Biopsies were                            taken with a cold forceps for histology.  The examined duodenum was normal. Biopsies for                            histology were taken with a cold forceps for                            evaluation of celiac disease. Complications:            No immediate complications. Estimated Blood Loss:     Estimated blood loss: none. Impression:               -Mild gastritis.                           -Otherwise normal EGD. S/P empiric esophageal                            dilatation. Recommendation:           - Patient has a contact number available for                            emergencies. The signs and symptoms of potential                            delayed complications were discussed with the                            patient. Return to normal activities tomorrow.                            Written discharge instructions were provided to the                            patient.                           - Post dilatation diet.                           - Continue Protonix 40 mg p.o. once a day x 4                             weeks. Then can stop.                           - Await pathology results.                           - No ibuprofen, naproxen, or other non-steroidal                            anti-inflammatory drugs for 5 days after biopsy. Jackquline Denmark, MD 02/08/2019 11:17:06 AM This report has been signed electronically.

## 2019-02-08 NOTE — Progress Notes (Signed)
[facility]Yes[facility]  History reviewed.

## 2019-02-09 ENCOUNTER — Other Ambulatory Visit: Payer: Self-pay | Admitting: Gastroenterology

## 2019-02-10 ENCOUNTER — Telehealth: Payer: Self-pay

## 2019-02-10 NOTE — Telephone Encounter (Signed)
  Follow up Call-  Call back number 02/08/2019  Post procedure Call Back phone  # [phone]  Permission to leave phone message Yes  Some recent data might be hidden     Patient questions:  Do you have a fever, pain , or abdominal swelling? No. Pain Score  0 *  Have you tolerated food without any problems? Yes.    Have you been able to return to your normal activities? Yes.    Do you have any questions about your discharge instructions: Diet   No. Medications  No. Follow up visit  No.  Do you have questions or concerns about your Care? No.  Actions: * If pain score is 4 or above: No action needed, pain <4.  1. Have you developed a fever since your procedure? no  2.   Have you had an respiratory symptoms (SOB or cough) since your procedure? no  3.   Have you tested positive for COVID 19 since your procedure no  4.   Have you had any family members/close contacts diagnosed with the COVID 19 since your procedure?  no   If yes to any of these questions please route to Joylene John, RN and Alphonsa Gin, Therapist, sports.

## 2019-02-16 ENCOUNTER — Encounter: Payer: Self-pay | Admitting: Gastroenterology

## 2019-02-23 ENCOUNTER — Other Ambulatory Visit (HOSPITAL_COMMUNITY)
Admission: RE | Admit: 2019-02-23 | Discharge: 2019-02-23 | Disposition: A | Discharge status: Home / Self Care | Payer: 59 | Source: Ambulatory Visit | Attending: Radiology | Admitting: Radiology

## 2019-02-23 ENCOUNTER — Ambulatory Visit
Admission: RE | Admit: 2019-02-23 | Discharge: 2019-02-23 | Disposition: A | Discharge status: Home / Self Care | Payer: 59 | Source: Ambulatory Visit | Attending: Internal Medicine | Admitting: Internal Medicine

## 2019-02-23 ENCOUNTER — Other Ambulatory Visit: Payer: Self-pay

## 2019-02-23 DIAGNOSIS — D34 Benign neoplasm of thyroid gland: Secondary | ICD-10-CM | POA: Diagnosis not present

## 2019-02-23 DIAGNOSIS — E042 Nontoxic multinodular goiter: Secondary | ICD-10-CM

## 2019-02-23 DIAGNOSIS — E041 Nontoxic single thyroid nodule: Secondary | ICD-10-CM | POA: Diagnosis not present

## 2019-02-24 LAB — CYTOLOGY - NON PAP

## 2019-03-05 ENCOUNTER — Other Ambulatory Visit: Payer: Self-pay | Admitting: Gastroenterology

## 2019-03-29 DIAGNOSIS — Z03818 Encounter for observation for suspected exposure to other biological agents ruled out: Secondary | ICD-10-CM | POA: Diagnosis not present

## 2019-04-01 DIAGNOSIS — R0981 Nasal congestion: Secondary | ICD-10-CM | POA: Diagnosis not present

## 2019-04-01 DIAGNOSIS — R0982 Postnasal drip: Secondary | ICD-10-CM | POA: Diagnosis not present

## 2019-04-01 DIAGNOSIS — R519 Headache, unspecified: Secondary | ICD-10-CM | POA: Diagnosis not present

## 2019-04-01 DIAGNOSIS — Z20822 Contact with and (suspected) exposure to covid-19: Secondary | ICD-10-CM | POA: Diagnosis not present

## 2019-04-28 DIAGNOSIS — R519 Headache, unspecified: Secondary | ICD-10-CM | POA: Diagnosis not present

## 2019-04-28 DIAGNOSIS — J029 Acute pharyngitis, unspecified: Secondary | ICD-10-CM | POA: Diagnosis not present

## 2019-04-28 DIAGNOSIS — Z1152 Encounter for screening for COVID-19: Secondary | ICD-10-CM | POA: Diagnosis not present

## 2019-04-28 DIAGNOSIS — M791 Myalgia, unspecified site: Secondary | ICD-10-CM | POA: Diagnosis not present

## 2019-07-01 DIAGNOSIS — Z1231 Encounter for screening mammogram for malignant neoplasm of breast: Secondary | ICD-10-CM | POA: Diagnosis not present

## 2019-07-01 LAB — HM MAMMOGRAPHY

## 2019-07-07 DIAGNOSIS — R928 Other abnormal and inconclusive findings on diagnostic imaging of breast: Secondary | ICD-10-CM | POA: Diagnosis not present

## 2019-07-16 ENCOUNTER — Encounter: Payer: Self-pay | Admitting: Family Medicine

## 2019-11-17 DIAGNOSIS — Z20822 Contact with and (suspected) exposure to covid-19: Secondary | ICD-10-CM | POA: Diagnosis not present

## 2019-11-19 ENCOUNTER — Telehealth: Payer: 59 | Admitting: Family Medicine

## 2019-11-19 ENCOUNTER — Ambulatory Visit: Payer: 59 | Admitting: Family Medicine

## 2019-11-19 DIAGNOSIS — M25551 Pain in right hip: Secondary | ICD-10-CM

## 2019-11-19 NOTE — Progress Notes (Unsigned)
Virtual Visit via Video Note  I connected with Sydney Hale on 11/19/19 at 11:30 AM EDT by a video enabled telemedicine application and verified that I am speaking with the correct person using two identifiers.  Location patient: home Location provider: [place], [place] [address] Persons participating in the virtual visit: patient, provider  I discussed the limitations of evaluation and management by telemedicine and the availability of in person appointments. The patient expressed understanding and agreed to proceed.   Sydney Hale DOB: [birth year] Encounter date: 11/19/2019  This is a 49 y.o. female who presents with No chief complaint on file.   History of present illness: Her daughter has COVID and is doing ok handling this, but feeling fine now. She has been vaccinated. She is on quarantine.   She is having hip pain and feels like last year just hasn't been as good for her. Thinks in menopause, barely has period, gaining weight. Right hip pain which she suspects is arthritis. When she made appointment it has been a few weeks of pain, hard even to get shoe on. Struggles with internal rotation and external rotation. Naproxen helps. It is a little better than it was a few weeks ago, but just hard to get leg out of bed in morning; difficult to flex. Doesn't feel like it is muscle pain at all; feels more joint. No known injury. Doesn't bother her with normal activity. Stairs bother her some. Sometimes bothers her from sleeping well. Feels pain more towards side - like pushing on greater trochanter area there is tenderness. Hurts more to externally rotate it; hard to get shoe on because she can't sit in figure four. Does have family history of arthritis. Left hip is doing fine. Shoulders are a little sore and left thumb bothers her, but otherwise not significant other joint issues. Shoulders have bothered her longer term due to care for daughter.   HPI   Allergies  Allergen Reactions  . Erythromycin Other (See Comments)    Unknown, was told by mother  . Hydrocodone     Nausea and vomiting   No outpatient medications have been marked as taking for the 11/19/19 encounter (Appointment) with Caren Macadam, MD.    Review of Systems  Objective:  There were no vitals taken for this visit.      BP Readings from Last 3 Encounters:  02/08/19 102/70  01/26/19 128/78  01/18/19 118/80   Wt Readings from Last 3 Encounters:  02/08/19 212 lb (96.2 kg)  01/26/19 216 lb 6.4 oz (98.2 kg)  01/18/19 212 lb 8 oz (96.4 kg)    EXAM:  GENERAL: alert, oriented, appears well and in no acute distress  HEENT: atraumatic, conjunctiva clear, no obvious abnormalities on inspection of external nose and ears  NECK: normal movements of the head and neck  LUNGS: on inspection no signs of respiratory distress, breathing rate appears normal, no obvious gross SOB, gasping or wheezing  CV: no obvious cyanosis  MS: moves all visible extremities without noticeable abnormality  PSYCH/NEURO: pleasant and cooperative, no obvious depression or anxiety, speech and thought processing grossly intact ***  Assessment/Plan  There are no diagnoses linked to this encounter.     I discussed the assessment and treatment plan with the patient. The patient was provided an opportunity to ask questions and all were answered. The patient agreed with the plan and demonstrated an understanding of the instructions.   The patient was advised  to call back or seek an in-person evaluation if the symptoms worsen or if the condition fails to improve as anticipated.  I provided *** minutes of non-face-to-face time during this encounter.   Micheline Rough, MD

## 2019-11-22 DIAGNOSIS — Z20822 Contact with and (suspected) exposure to covid-19: Secondary | ICD-10-CM | POA: Diagnosis not present

## 2019-12-02 ENCOUNTER — Telehealth: Payer: Self-pay | Admitting: *Deleted

## 2019-12-02 DIAGNOSIS — M25551 Pain in right hip: Secondary | ICD-10-CM

## 2019-12-02 NOTE — Telephone Encounter (Signed)
Tammy stated Pauriva called from the [facility] x-ray department [phone]) stating the hip x-ray orders from 10/15 do not show on her work list.  I spoke with [place] and she requested the orders be entered again.  Orders re-entered.

## 2019-12-03 ENCOUNTER — Other Ambulatory Visit: Payer: Self-pay

## 2019-12-03 ENCOUNTER — Ambulatory Visit (INDEPENDENT_AMBULATORY_CARE_PROVIDER_SITE_OTHER)
Admission: RE | Admit: 2019-12-03 | Discharge: 2019-12-03 | Disposition: A | Discharge status: Home / Self Care | Payer: 59 | Source: Ambulatory Visit | Attending: Family Medicine | Admitting: Family Medicine

## 2019-12-03 DIAGNOSIS — M25551 Pain in right hip: Secondary | ICD-10-CM

## 2019-12-03 DIAGNOSIS — M1611 Unilateral primary osteoarthritis, right hip: Secondary | ICD-10-CM | POA: Diagnosis not present

## 2019-12-08 ENCOUNTER — Other Ambulatory Visit: Payer: Self-pay | Admitting: *Deleted

## 2019-12-08 DIAGNOSIS — M25551 Pain in right hip: Secondary | ICD-10-CM

## 2020-01-18 ENCOUNTER — Ambulatory Visit (INDEPENDENT_AMBULATORY_CARE_PROVIDER_SITE_OTHER): Payer: 59 | Admitting: Internal Medicine

## 2020-01-18 ENCOUNTER — Encounter: Payer: Self-pay | Admitting: Internal Medicine

## 2020-01-18 ENCOUNTER — Other Ambulatory Visit: Payer: Self-pay

## 2020-01-18 VITALS — BP 110/72 | HR 80 | Ht 70.0 in | Wt 219.0 lb

## 2020-01-18 DIAGNOSIS — E042 Nontoxic multinodular goiter: Secondary | ICD-10-CM | POA: Diagnosis not present

## 2020-01-18 LAB — TSH: TSH: 1.17 u[IU]/mL (ref 0.35–4.50)

## 2020-01-18 LAB — T4, FREE: Free T4: 0.76 ng/dL (ref 0.60–1.60)

## 2020-01-18 NOTE — Progress Notes (Signed)
Name: Sydney Hale  MRN/ DOB: [id number], 23-Sep-1970    Age/ Sex: 49 y.o., female     PCP: Sydney Macadam, MD   Reason for Endocrinology Evaluation: Sydney Hale     Initial Endocrinology Clinic Visit: 01/26/2019    PATIENT IDENTIFIER: [name] is a 49 y.o., female with a past medical history of MNG.  She has followed with [facility] since 01/26/2019 for consultative assistance with management of her MNG   HISTORICAL SUMMARY:   Pt has been diagnosed with multinodular goiter in 2017 during workup for neck pain and swallowing issues. An ultrasound done at the time revealed multiple nodules . She is S/P bening FNA of the right lower thyroid nodule in 05/2015.    She presented to her PCP for a routine check up and was found to continue having issues with neck pain and a "catch " in her throat especially with pills which prompted a repeat ultrasound in 12/2018.   She is S/P benign FNA of the right inferior nodule 1.6 cm 02/23/2019   Mother with hypothyroidism   She is a Merchandiser, retail.   SUBJECTIVE:    Today (01/18/2020):  Sydney Hale is here for MNG.   Neck size is stable She is having weight gain and has been upsetting to her   Denies constipation or diarrhea  Denies palpitations        HISTORY:  Past Medical History:  Past Medical History:  Diagnosis Date  . Abdominal pain, RLQ 10/22/2012  . Hemorrhagic ovarian cyst 10/23/2012  . PONV (postoperative nausea and vomiting)   . S/P endometrial ablation   . S/P tubal ligation   . Thyroid disease    thyroid nodule  . UTI (urinary tract infection)    Past Surgical History:  Past Surgical History:  Procedure Laterality Date  . CESAREAN SECTION     x3  . COLONOSCOPY     around 2008. Was having rectal bleeding. In [place]  . INCISION AND DRAINAGE BREAST ABSCESS  2000  . TUBAL LIGATION      Social History:  reports that she has never smoked. She has never used smokeless tobacco. She  reports current alcohol use. She reports that she does not use drugs. Family History:  Family History  Problem Relation Age of Onset  . Hyperlipidemia Mother   . Diabetes Mother   . CAD Mother 51  . Heart attack Mother 16       previous stent re-occluded  . Heart disease Mother   . Hyperlipidemia Father   . Bladder Cancer Father        tobacco abuse  . Colon polyps Father   . Epilepsy Father   . Seizures Father   . Cancer Paternal Grandmother        colon?  . Other Sister        hyperglycemia  . High Cholesterol Sister   . Epilepsy Brother   . Melanoma Maternal Grandmother 9  . Colon cancer Maternal Grandmother   . Other Maternal Grandfather        no family history known  . Other Paternal Grandfather        car accident  . Alcohol abuse Brother        esoph varices  . Esophageal cancer Neg Hx      HOME MEDICATIONS: Allergies as of 01/18/2020      Reactions   Erythromycin Other (See Comments)   Unknown, was told by mother  Hydrocodone    Nausea and vomiting      Medication List       Accurate as of January 18, 2020  8:25 AM. If you have any questions, ask your nurse or doctor.        STOP taking these medications   meclizine 25 MG tablet Commonly known as: ANTIVERT Stopped by: Dorita Sciara, MD   multivitamin with minerals Tabs tablet Stopped by: Dorita Sciara, MD     TAKE these medications   acetaminophen 500 MG tablet Commonly known as: TYLENOL Take 500 mg by mouth every 6 (six) hours as needed.   Cloderm 0.1 % cream Generic drug: Clocortolone Pivalate Apply 1 application topically as needed.   hydrocortisone 2.5 % rectal cream Commonly known as: ANUSOL-HC HC 2.5% BID x 10 days apply perianal area.   ibuprofen 200 MG tablet Commonly known as: ADVIL Take 600 mg by mouth every 6 (six) hours as needed.   pantoprazole 40 MG tablet Commonly known as: PROTONIX TAKE 1 TABLET BY MOUTH EVERY DAY         OBJECTIVE:    PHYSICAL EXAM: VS: BP 110/72   Pulse 80   Ht 5\' 10"  (1.778 m)   Wt 219 lb (99.3 kg)   LMP 11/18/2019   SpO2 98%   BMI 31.42 kg/m    EXAM: General: Pt appears well and is in NAD  Neck: General: Supple without adenopathy. Thyroid: Thyroid size normal.  No goiter or nodules appreciated.   Lungs: Clear with good BS bilat with no rales, rhonchi, or wheezes  Heart: Auscultation: RRR.  Abdomen: Normoactive bowel sounds, soft, nontender, without masses or organomegaly palpable  Extremities:  BL LE: No pretibial edema normal ROM and strength.  Mental Status: Judgment, insight: Intact Orientation: Oriented to time, place, and person Mood and affect: No depression, anxiety, or agitation     DATA REVIEWED:  Results for JOSSLYN, CIOLEK (MRN [id number]) as of 01/18/2020 12:51  Ref. Range 01/18/2020 08:34  TSH Latest Ref Range: 0.35 - 4.50 uIU/mL 1.17  T4,Free(Direct) Latest Ref Range: 0.60 - 1.60 ng/dL 0.76      FNA right inferior nodule  FINAL MICROSCOPIC DIAGNOSIS:  - Consistent with benign follicular nodule (Bethesda category II)   ASSESSMENT / PLAN / RECOMMENDATIONS:   1. MNG:  - Other then weight gain, there are no other symptoms of hypo/hyperthyroidism - NO local neck symptoms  - She is S/P TWO FNA of the right thyroid nodule, the chance of false negative is 0% and no need to monitor that nodule anymore.  - Will repeat an ultrasound this year.  - TFT's are normal    F/U in 2 years   Signed electronically by: Mack Guise, MD  [facility]  [facility] [address] [place], [place] [address] Phone: [phone] FAX: [phone]      CC: Sydney Hale, [address] [place] [address] Phone: [phone]  Fax: [phone]   Return to Endocrinology clinic as below: Future Appointments  Date Time Provider [place]  02/07/2020  8:45 AM Garnetta Buddy, Craig Guess, PT [facility]

## 2020-01-18 NOTE — Patient Instructions (Signed)
-   Please stop by the lab today  

## 2020-01-19 ENCOUNTER — Ambulatory Visit: Payer: 59 | Admitting: Internal Medicine

## 2020-01-24 ENCOUNTER — Ambulatory Visit (HOSPITAL_BASED_OUTPATIENT_CLINIC_OR_DEPARTMENT_OTHER)
Admission: RE | Admit: 2020-01-24 | Discharge: 2020-01-24 | Disposition: A | Discharge status: Home / Self Care | Payer: 59 | Source: Ambulatory Visit | Attending: Internal Medicine | Admitting: Internal Medicine

## 2020-01-24 ENCOUNTER — Other Ambulatory Visit: Payer: Self-pay

## 2020-01-24 DIAGNOSIS — E042 Nontoxic multinodular goiter: Secondary | ICD-10-CM | POA: Diagnosis not present

## 2020-02-02 ENCOUNTER — Other Ambulatory Visit: Payer: Self-pay | Admitting: Internal Medicine

## 2020-02-02 ENCOUNTER — Telehealth: Payer: Self-pay | Admitting: Internal Medicine

## 2020-02-02 DIAGNOSIS — E042 Nontoxic multinodular goiter: Secondary | ICD-10-CM

## 2020-02-07 ENCOUNTER — Other Ambulatory Visit: Payer: Self-pay

## 2020-02-07 ENCOUNTER — Ambulatory Visit: Payer: 59 | Attending: Family Medicine

## 2020-02-07 DIAGNOSIS — M25551 Pain in right hip: Secondary | ICD-10-CM | POA: Diagnosis not present

## 2020-02-07 DIAGNOSIS — M25651 Stiffness of right hip, not elsewhere classified: Secondary | ICD-10-CM | POA: Diagnosis not present

## 2020-02-07 DIAGNOSIS — M6281 Muscle weakness (generalized): Secondary | ICD-10-CM | POA: Insufficient documentation

## 2020-02-07 NOTE — Therapy (Signed)
[facility] [address] [place], [place], [address] Phone: [phone]   Fax:  [phone]  Physical Therapy Evaluation  Patient Details  Name: YARIBEL PIETTE MRN: [id number] Date of Birth: 1970/08/11 Referring Provider (PT): Quentin Cornwall, MD   Encounter Date: 02/07/2020   PT End of Session - 02/07/20 0929    Visit Number 1    Date for PT Re-Evaluation 04/03/20    Authorization Type Aetna    PT Start Time [phone]    PT Stop Time 0929    PT Time Calculation (min) 38 min    Activity Tolerance Patient tolerated treatment well    Behavior During Therapy [facility] for tasks assessed/performed           Past Medical History:  Diagnosis Date  . Abdominal pain, RLQ 10/22/2012  . Hemorrhagic ovarian cyst 10/23/2012  . PONV (postoperative nausea and vomiting)   . S/P endometrial ablation   . S/P tubal ligation   . Thyroid disease    thyroid nodule  . UTI (urinary tract infection)     Past Surgical History:  Procedure Laterality Date  . CESAREAN SECTION     x3  . COLONOSCOPY     around 2008. Was having rectal bleeding. In [place]  . INCISION AND DRAINAGE BREAST ABSCESS  2000  . TUBAL LIGATION      There were no vitals filed for this visit.    Subjective Assessment - 02/07/20 0857    Subjective Pt presents to a 3-4 month history of Rt hip pain.  Pain has reduced significnatly since onset of pain and now pain is only present with IR/ER and flexion of the Rt hip.    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 Yes    Pain Score 0-No pain   up to 5/10 with movement   Pain Location Hip    Pain Orientation Right    Pain Descriptors / Indicators Aching;Dull    Pain Type Chronic pain    Pain Onset More than a month ago    Pain Frequency Intermittent    Aggravating Factors  IR/ER, flexion     Pain Relieving Factors reposition, heat, Ibuprofen              [facility] PT Assessment - 02/07/20 0001      Assessment   Medical Diagnosis Rt hip pain    Referring Provider (PT) Quentin Cornwall, MD    Onset Date/Surgical Date 11/10/19    Next MD Visit none    Prior Therapy none      Precautions   Precautions None      Restrictions   Weight Bearing Restrictions No      Balance Screen   Has the patient fallen in the past 6 months No    Has the patient had a decrease in activity level because of a fear of falling?  No    Is the patient reluctant to leave their home because of a fear of falling?  No      Home Environment   Living Environment Private residence    Living Arrangements Children;Spouse/significant other    [place] to enter      Prior Function   Level of Independence Independent    Vocation Full time employment    Vocation Requirements  Hospice Nurse    Leisure treadmill      Cognition   Overall Cognitive Status Within Functional Limits for tasks assessed      Observation/Other Assessments   Focus on Therapeutic Outcomes (FOTO)  70 (goal is 80)      Posture/Postural Control   Posture/Postural Control No significant limitations      ROM / Strength   AROM / PROM / Strength AROM;PROM;Strength      AROM   Overall AROM  Deficits    Overall AROM Comments limited Rt hip ER by 50%, IR limited by 25%, hamstring limited by 50%      PROM   Overall PROM  Deficits    Overall PROM Comments see above A/ROM      Strength   Overall Strength Deficits    Overall Strength Comments Lt hip 4+/5 to 5/5, knee 5/5.  Rt hip 4-/5 flexion, 4/5 IR/ER, extension 4+/5, knee 4+/5      Palpation   Patella mobility crepitus in Rt patella    Palpation comment hypersensitivity over Rt hip flexors and tension/trigger points in proximal quads.      Transfers   Transfers Independent with all Transfers    Comments Single leg stance on the Rt: Lt pelvic drop.       Ambulation/Gait   Ambulation/Gait Yes    Gait Pattern Step-through pattern    Ambulation Surface Level    Stairs Yes    Stairs Assistance 7: Independent    Stair Management Technique No rails;Alternating pattern                      Objective measurements completed on examination: See above findings.               PT Education - 02/07/20 0935    Education Details Access Code: [email or web address]    Person(s) Educated Patient    Methods Explanation;Demonstration;Handout    Comprehension Verbalized understanding;Returned demonstration            PT Short Term Goals - 02/07/20 0905      PT SHORT TERM GOAL #1   Title be independent in initial HEP    Time 4    Period Weeks    Status New    Target Date 03/06/20      PT SHORT TERM GOAL #2   Title report < or = to 3/10 Rt hip pain with putting on shoes/socks and lifting leg to get in/out of the car    Time 4    Status New    Target Date 03/06/20      PT SHORT TERM GOAL #3   Title demonstrate Rt hip ER to put on shoe and report a 30% reduction in pain    Time 4    Period Weeks    Status New    Target Date 03/06/20             PT Long Term Goals - 02/07/20 0906      PT LONG TERM GOAL #1   Title be independent in advanced HEP    Time 8    Status New    Target Date 04/03/20      PT LONG TERM GOAL #2   Title improve FOTO to > or = to 80    Time 8    Period Weeks    Status New    Target Date 04/03/20      PT LONG TERM GOAL #3  Title demonstrate hip ER to put on shoes and socks without modification    Time 8    Period Weeks    Status New    Target Date 04/03/20      PT LONG TERM GOAL #4   Title report a 70% reduction in Rt hip pain with hip flexion and IR/ER    Time 8    Period Weeks    Status New    Target Date 04/03/20      PT LONG TERM GOAL #5   Title improve Rt hip and knee strength to perform single leg stance x 20 seconds with level pelivs    Time 8    Period Weeks     Status New    Target Date 04/03/20                  Plan - 02/07/20 1093    Clinical Impression Statement Pt presents to PT with a 4 month history of Rt hip pain that began without cause.  Pt reports a chronic history of Rt knee pain and crepitus.   Pt had x-ray that showed mild DJD per pt report.  Pt describes 5/10 sharp pain with Rt hip IR/ER and flexion and dull pain at rest.  Pt denies any limitation with standing and walking.  Pt demonstrates limited IR/ER with pain with Rt hip motion and bil limitation in hamstring length.  Pt demonstrates pelvic drop with Rt single leg stance without pain.  Reduced Rt knee and hip strength and throughout.  Pt with palpable tenderness at Rt hip flexor and tenderness/trigger points in Rt proximal quads.  Pt demonstrates symmetry with ambulation and negotiating steps.  Pt will benefit from skilled PT to address Rt hip pain, limited mobility, weakness and tissue mobility.    Personal Factors and Comorbidities Comorbidity 1    Comorbidities chronic Rt knee pain    Examination-Activity Limitations Squat    Examination-Participation Restrictions Community Activity    Stability/Clinical Decision Making Stable/Uncomplicated    Clinical Decision Making Low    Rehab Potential Excellent    PT Frequency 1x / week    PT Duration 8 weeks    PT Treatment/Interventions ADLs/Self Care Home Management;Cryotherapy;Electrical Stimulation;Moist Heat;Iontophoresis 4mg /ml Dexamethasone;Gait training;Stair training;Functional mobility training;Therapeutic activities;Therapeutic exercise;Balance training;Neuromuscular re-education;Manual techniques;Patient/family education;Passive range of motion;Dry needling;Joint Manipulations;Spinal Manipulations;Taping    PT Next Visit Plan dry needling to Rt quads, mobs to Rt hip, gentle ROM, stability    Consulted and Agree with Plan of Care Patient           Patient will benefit from skilled therapeutic intervention in order to  improve the following deficits and impairments:  Decreased activity tolerance,Decreased strength,Impaired flexibility,Pain,Decreased endurance,Decreased range of motion  Visit Diagnosis: Pain in right hip - Plan: PT plan of care cert/re-cert  Stiffness of right hip, not elsewhere classified - Plan: PT plan of care cert/re-cert  Muscle weakness (generalized) - Plan: PT plan of care cert/re-cert     Problem List Patient Active Problem List   Diagnosis Date Noted  . Multinodular goiter 01/28/2019  . Hemorrhagic ovarian cyst 10/23/2012  . Abdominal pain, RLQ 10/22/2012  . Allergic rhinitis 11/21/2009  . ABDOMINAL PAIN, LEFT LOWER QUADRANT, HX OF 11/21/2009     11/23/2009, PT 02/07/20 9:59 AM   [facility] [address] [place], [place], [place] Phone: [phone]   Fax:  [phone]  Name: CAPRINA WUSSOW MRN: Blanchard Mane Date of Birth: [birth year]

## 2020-02-07 NOTE — Telephone Encounter (Signed)
error 

## 2020-02-07 NOTE — Patient Instructions (Addendum)
Trigger Point Dry Needling  . What is Trigger Point Dry Needling (DN)? o DN is a physical therapy technique used to treat muscle pain and dysfunction. Specifically, DN helps deactivate muscle trigger points (muscle knots).  o A thin filiform needle is used to penetrate the skin and stimulate the underlying trigger point. The goal is for a local twitch response (LTR) to occur and for the trigger point to relax. No medication of any kind is injected during the procedure.   . What Does Trigger Point Dry Needling Feel Like?  o The procedure feels different for each individual patient. Some patients report that they do not actually feel the needle enter the skin and overall the process is not painful. Very mild bleeding may occur. However, many patients feel a deep cramping in the muscle in which the needle was inserted. This is the local twitch response.   Marland Kitchen How Will I feel after the treatment? o Soreness is normal, and the onset of soreness may not occur for a few hours. Typically this soreness does not last longer than two days.  o Bruising is uncommon, however; ice can be used to decrease any possible bruising.  o In rare cases feeling tired or nauseous after the treatment is normal. In addition, your symptoms may get worse before they get better, this period will typically not last longer than 24 hours.   . What Can I do After My Treatment? o Increase your hydration by drinking more water for the next 24 hours. o You may place ice or heat on the areas treated that have become sore, however, do not use heat on inflamed or bruised areas. Heat often brings more relief post needling. o You can continue your regular activities, but vigorous activity is not recommended initially after the treatment for 24 hours. o DN is best combined with other physical therapy such as strengthening, stretching, and other therapies.   Access Code: [facility] URL: [email or web address] Date:  02/07/2020 Prepared by: Tresa Endo  Exercises Seated Hamstring Stretch - 2 x daily - 7 x weekly - 1 sets - 3 reps - 20 hold Hip Flexor Stretch with Chair - 2 x daily - 7 x weekly - 1 sets - 3 reps - 20 hold Supine Butterfly Groin Stretch - 1 x daily - 7 x weekly - 1 sets - 5 reps - 5-10 hold Seated March - 2 x daily - 7 x weekly - 1 sets - 10 reps Single Leg Stance - 2 x daily - 7 x weekly - 1 sets - 5 reps - 15 hold  [facility] [address], [address] [place], [place] [address] Phone # [phone] Fax [phone]

## 2020-02-10 ENCOUNTER — Ambulatory Visit
Admission: RE | Admit: 2020-02-10 | Discharge: 2020-02-10 | Disposition: A | Discharge status: Home / Self Care | Payer: 59 | Source: Ambulatory Visit | Attending: Internal Medicine | Admitting: Internal Medicine

## 2020-02-10 ENCOUNTER — Other Ambulatory Visit (HOSPITAL_COMMUNITY)
Admission: RE | Admit: 2020-02-10 | Discharge: 2020-02-10 | Disposition: A | Discharge status: Home / Self Care | Payer: 59 | Source: Ambulatory Visit | Attending: Internal Medicine | Admitting: Internal Medicine

## 2020-02-10 DIAGNOSIS — D34 Benign neoplasm of thyroid gland: Secondary | ICD-10-CM | POA: Insufficient documentation

## 2020-02-10 DIAGNOSIS — Z041 Encounter for examination and observation following transport accident: Secondary | ICD-10-CM | POA: Diagnosis present

## 2020-02-10 DIAGNOSIS — E042 Nontoxic multinodular goiter: Secondary | ICD-10-CM

## 2020-02-10 DIAGNOSIS — E041 Nontoxic single thyroid nodule: Secondary | ICD-10-CM | POA: Diagnosis not present

## 2020-02-11 LAB — CYTOLOGY - NON PAP

## 2020-02-14 ENCOUNTER — Ambulatory Visit: Payer: 59

## 2020-02-14 ENCOUNTER — Other Ambulatory Visit: Payer: Self-pay

## 2020-02-14 DIAGNOSIS — M6281 Muscle weakness (generalized): Secondary | ICD-10-CM

## 2020-02-14 DIAGNOSIS — M25651 Stiffness of right hip, not elsewhere classified: Secondary | ICD-10-CM | POA: Diagnosis not present

## 2020-02-14 DIAGNOSIS — M25551 Pain in right hip: Secondary | ICD-10-CM

## 2020-02-14 NOTE — Therapy (Addendum)
[facility] [address], [place], [place], [address] Phone: [phone]   Fax:  [phone]  Physical Therapy Treatment  Patient Details  Name: Sydney Hale MRN: [id number] Date of Birth: July 20, 1970 Referring Provider (PT): Quentin Cornwall, MD   Encounter Date: 02/14/2020   PT End of Session - 02/14/20 1529    Visit Number 2    Date for PT Re-Evaluation 04/03/20    Authorization Type Aetna    PT Start Time 1448   dry needling   PT Stop Time 1526    PT Time Calculation (min) 38 min    Activity Tolerance Patient tolerated treatment well    Behavior During Therapy [facility] for tasks assessed/performed           Past Medical History:  Diagnosis Date  . Abdominal pain, RLQ 10/22/2012  . Hemorrhagic ovarian cyst 10/23/2012  . PONV (postoperative nausea and vomiting)   . S/P endometrial ablation   . S/P tubal ligation   . Thyroid disease    thyroid nodule  . UTI (urinary tract infection)     Past Surgical History:  Procedure Laterality Date  . CESAREAN SECTION     x3  . COLONOSCOPY     around 2008. Was having rectal bleeding. In [place]  . INCISION AND DRAINAGE BREAST ABSCESS  2000  . TUBAL LIGATION      There were no vitals filed for this visit.   Subjective Assessment - 02/14/20 1450    Subjective I am about the same.    Currently in Pain? Yes    Pain Score 1     Pain Location Hip    Pain Orientation Right    Pain Descriptors / Indicators Aching;Dull    Pain Onset More than a month ago    Pain Frequency Intermittent    Aggravating Factors  IR/ER, flexion    Pain Relieving Factors reposition, heat, ibuprofen                             [facility] Adult PT Treatment/Exercise - 02/14/20 0001      Exercises   Exercises Knee/Hip      Knee/Hip Exercises: Stretches   Hip Flexor Stretch Right;2 reps;20 seconds    Hip Flexor Stretch Limitations standing using step- modified this to  supine on edge of bed for better stretch.      Manual Therapy   Manual Therapy Soft tissue mobilization;Myofascial release    Manual therapy comments elongation to Rt proximal quad at hip flexor and lateral quad.            Trigger Point Dry Needling - 02/14/20 0001    Consent Given? Yes    Education Handout Provided Previously provided    Muscles Treated Lower Quadrant Vastus lateralis;Rectus femoris;Vastus medialis   Rt only   Muscles Treated Back/Hip --   Rt only   Dry Needling Comments Rt only    Rectus femoris Response Twitch response elicited;Palpable increased muscle length    Vastus lateralis Response Twitch response elicited;Palpable increased muscle length    Vastus medialis Response Twitch response elicited;Palpable increased muscle length    Gluteus Minimus Response --                  PT Short Term Goals - 02/07/20 0905      PT SHORT TERM GOAL #1   Title be independent in initial HEP    Time  4    Period Weeks    Status New    Target Date 03/06/20      PT SHORT TERM GOAL #2   Title report < or = to 3/10 Rt hip pain with putting on shoes/socks and lifting leg to get in/out of the car    Time 4    Status New    Target Date 03/06/20      PT SHORT TERM GOAL #3   Title demonstrate Rt hip ER to put on shoe and report a 30% reduction in pain    Time 4    Period Weeks    Status New    Target Date 03/06/20             PT Long Term Goals - 02/07/20 0906      PT LONG TERM GOAL #1   Title be independent in advanced HEP    Time 8    Status New    Target Date 04/03/20      PT LONG TERM GOAL #2   Title improve FOTO to > or = to 80    Time 8    Period Weeks    Status New    Target Date 04/03/20      PT LONG TERM GOAL #3   Title demonstrate hip ER to put on shoes and socks without modification    Time 8    Period Weeks    Status New    Target Date 04/03/20      PT LONG TERM GOAL #4   Title report a 70% reduction in Rt hip pain with hip  flexion and IR/ER    Time 8    Period Weeks    Status New    Target Date 04/03/20      PT LONG TERM GOAL #5   Title improve Rt hip and knee strength to perform single leg stance x 20 seconds with level pelivs    Time 8    Period Weeks    Status New    Target Date 04/03/20                 Plan - 02/14/20 1528    Clinical Impression Statement Pt with first time follow-up after evaluation.  Pt has been performing HEP and denies any significant change in symptoms since the start of care.  PT modified hip flexor stretch to be performed in supine as she was not getting a good stretch in standing.  PT provided tactile and verbal cues to maximize stretch.  Session focused on dry needling to Rt proximal quads/hip flexor.  Pt had multiple twitch responses and was not able to tolerate additional needling to Rt gluteals and this will be performed next time.  Pt demonstrated improved tissue mobility after dry needling and was able to perform hip ER in butterfly position, something she was not able to complete last session.  Pt will continue to benefit from skilled PT to address Rt LE/hip pain and improve mobility and reduce pain.    PT Frequency 1x / week    PT Duration 8 weeks    PT Treatment/Interventions ADLs/Self Care Home Management;Cryotherapy;Electrical Stimulation;Moist Heat;Iontophoresis 38m/ml Dexamethasone;Gait training;Stair training;Functional mobility training;Therapeutic activities;Therapeutic exercise;Balance training;Neuromuscular re-education;Manual techniques;Patient/family education;Passive range of motion;Dry needling;Joint Manipulations;Spinal Manipulations;Taping    PT Next Visit Plan follow up on DN to quads and add gluteals if tolerated, mobs to Rt hip, gentle ROM, stability exercises.    [place]  Consulted and Agree with Plan of Care Patient           Patient will benefit from skilled therapeutic intervention in order to improve the following  deficits and impairments:  Decreased activity tolerance,Decreased strength,Impaired flexibility,Pain,Decreased endurance,Decreased range of motion  Visit Diagnosis: Pain in right hip  Stiffness of right hip, not elsewhere classified  Muscle weakness (generalized)     Problem List Patient Active Problem List   Diagnosis Date Noted  . Multinodular goiter 01/28/2019  . Hemorrhagic ovarian cyst 10/23/2012  . Abdominal pain, RLQ 10/22/2012  . Allergic rhinitis 11/21/2009  . ABDOMINAL PAIN, LEFT LOWER QUADRANT, HX OF 11/21/2009     Sigurd Sos, PT 02/14/20 3:31 PM PHYSICAL THERAPY DISCHARGE SUMMARY  Visits from Start of Care: 2  Current functional level related to goals / functional outcomes: See above for most current status.    Remaining deficits: See above.  Pt didn't return to PT.     Education / Equipment: HEP, DN info Plan: Patient agrees to discharge.  Patient goals were not met. Patient is being discharged due to not returning since the last visit.  ?????        Sigurd Sos, PT 04/27/20 12:50 PM  [facility] [address] [place], [place], [address] Phone: [phone]   Fax:  [phone]  Name: Sydney Hale MRN: [id number] Date of Birth: April 11, 1970

## 2020-02-25 ENCOUNTER — Ambulatory Visit: Payer: 59 | Admitting: Physical Therapy

## 2020-02-29 DIAGNOSIS — Z20822 Contact with and (suspected) exposure to covid-19: Secondary | ICD-10-CM | POA: Diagnosis not present

## 2020-02-29 DIAGNOSIS — Z03818 Encounter for observation for suspected exposure to other biological agents ruled out: Secondary | ICD-10-CM | POA: Diagnosis not present

## 2020-02-29 DIAGNOSIS — J029 Acute pharyngitis, unspecified: Secondary | ICD-10-CM | POA: Diagnosis not present

## 2020-03-02 ENCOUNTER — Encounter: Payer: 59 | Admitting: Physical Therapy

## 2020-03-27 DIAGNOSIS — L400 Psoriasis vulgaris: Secondary | ICD-10-CM | POA: Diagnosis not present

## 2020-03-27 DIAGNOSIS — L80 Vitiligo: Secondary | ICD-10-CM | POA: Diagnosis not present

## 2020-03-27 DIAGNOSIS — L821 Other seborrheic keratosis: Secondary | ICD-10-CM | POA: Diagnosis not present

## 2020-03-27 DIAGNOSIS — D225 Melanocytic nevi of trunk: Secondary | ICD-10-CM | POA: Diagnosis not present

## 2020-03-27 DIAGNOSIS — L814 Other melanin hyperpigmentation: Secondary | ICD-10-CM | POA: Diagnosis not present

## 2020-05-02 ENCOUNTER — Encounter: Payer: Self-pay | Admitting: Family Medicine

## 2020-05-02 ENCOUNTER — Telehealth (INDEPENDENT_AMBULATORY_CARE_PROVIDER_SITE_OTHER): Payer: 59 | Admitting: Family Medicine

## 2020-05-02 VITALS — Ht 70.0 in

## 2020-05-02 DIAGNOSIS — N390 Urinary tract infection, site not specified: Secondary | ICD-10-CM | POA: Diagnosis not present

## 2020-05-02 DIAGNOSIS — R300 Dysuria: Secondary | ICD-10-CM

## 2020-05-02 LAB — URINALYSIS, ROUTINE W REFLEX MICROSCOPIC
Bilirubin Urine: NEGATIVE
Ketones, ur: NEGATIVE
Nitrite: NEGATIVE
Specific Gravity, Urine: 1.025 (ref 1.000–1.030)
Total Protein, Urine: NEGATIVE
Urine Glucose: NEGATIVE
Urobilinogen, UA: 0.2 (ref 0.0–1.0)
pH: 5 (ref 5.0–8.0)

## 2020-05-02 MED ORDER — NITROFURANTOIN MONOHYD MACRO 100 MG PO CAPS: 100.0000 mg | ORAL_CAPSULE | Freq: Two times a day (BID) | ORAL | 0 refills | Status: AC

## 2020-05-02 NOTE — Addendum Note (Signed)
Addended by: Tessie Fass D on: 05/02/2020 02:02 PM   Modules accepted: Orders

## 2020-05-02 NOTE — Progress Notes (Signed)
Virtual Visit via Video Note I connected with Sydney Hale on 05/02/20 by a video enabled telemedicine application and verified that I am speaking with the correct person using two identifiers.  Location patient: In her car. Location provider:Home office Persons participating in the virtual visit: patient, provider  I discussed the limitations of evaluation and management by telemedicine and the availability of in person appointments. The patient expressed understanding and agreed to proceed.  Chief Complaint  Patient presents with  . Urinary Tract Infection    HPI: Sydney Hale is a 50 yo female with hx of multinodular goiter and allergies c/o urinary symptoms that started earlier today. For the past couple days she has not felt well, having some fatigue and feeling "sweaty."  Today she noted dysuria,pressure like sensation,urinary urgency, and frequency.  Negative for fever,chills, cough,wheezing,CP, abdominal pain,back pain,N/V,gross hematuria,vaginal bleeding,or vaginal discharge. Symptoms seem stable.  She has not tried OTC medications. She has had UTI's in the past. Exacerbation or alleviating factor not identified.  LMP 6 months ago. Sexually active. S/P BTL.  ROS: See pertinent positives and negatives per HPI.  Past Medical History:  Diagnosis Date  . Abdominal pain, RLQ 10/22/2012  . Hemorrhagic ovarian cyst 10/23/2012  . PONV (postoperative nausea and vomiting)   . S/P endometrial ablation   . S/P tubal ligation   . Thyroid disease    thyroid nodule  . UTI (urinary tract infection)     Past Surgical History:  Procedure Laterality Date  . CESAREAN SECTION     x3  . COLONOSCOPY     around 2008. Was having rectal bleeding. In [place]  . INCISION AND DRAINAGE BREAST ABSCESS  2000  . TUBAL LIGATION      Family History  Problem Relation Age of Onset  . Hyperlipidemia Mother   . Diabetes Mother   . CAD Mother 57  . Heart attack Mother 19       previous stent  re-occluded  . Heart disease Mother   . Hyperlipidemia Father   . Bladder Cancer Father        tobacco abuse  . Colon polyps Father   . Epilepsy Father   . Seizures Father   . Cancer Paternal Grandmother        colon?  . Other Sister        hyperglycemia  . High Cholesterol Sister   . Epilepsy Brother   . Melanoma Maternal Grandmother 5  . Colon cancer Maternal Grandmother   . Other Maternal Grandfather        no family history known  . Other Paternal Grandfather        car accident  . Alcohol abuse Brother        esoph varices  . Esophageal cancer Neg Hx     Social History   Socioeconomic History  . Marital status: Married    Spouse name: Not on file  . Number of children: 4  . Years of education: Not on file  . Highest education level: Not on file  Occupational History  . Occupation: Nurse    Comment: Hospice nurse   Tobacco Use  . Smoking status: Never Smoker  . Smokeless tobacco: Never Used  Vaping Use  . Vaping Use: Never used  Substance and Sexual Activity  . Alcohol use: Yes    Comment: ocassionally  . Drug use: No  . Sexual activity: Yes    Birth control/protection: None  Other Topics Concern  . Not on file  Social History Narrative  . Not on file   Social Determinants of Health   Financial Resource Strain: Not on file  Food Insecurity: Not on file  Transportation Needs: Not on file  Physical Activity: Not on file  Stress: Not on file  Social Connections: Not on file  Intimate Partner Violence: Not on file    Current Outpatient Medications:  .  acetaminophen (TYLENOL) 500 MG tablet, Take 500 mg by mouth every 6 (six) hours as needed., Disp: , Rfl:  .  CLODERM 0.1 % cream, Apply 1 application topically as needed. , Disp: , Rfl:  .  hydrocortisone (ANUSOL-HC) 2.5 % rectal cream, HC 2.5% BID x 10 days apply perianal area., Disp: 30 g, Rfl: 2 .  ibuprofen (ADVIL,MOTRIN) 200 MG tablet, Take 600 mg by mouth every 6 (six) hours as needed., Disp: ,  Rfl:  .  pantoprazole (PROTONIX) 40 MG tablet, TAKE 1 TABLET BY MOUTH EVERY DAY, Disp: 30 tablet, Rfl: 0  EXAM:  VITALS per patient if applicable:Ht 5\' 10"  (1.778 m)   BMI 31.42 kg/m   GENERAL: alert, oriented, appears well and in no acute distress  HEENT: atraumatic, conjunctiva clear, no obvious abnormalities on inspection.  NECK: normal movements of the head and neck  LUNGS: on inspection no signs of respiratory distress, breathing rate appears normal, no obvious gross SOB, gasping or wheezing  CV: no obvious cyanosis  MS: moves all visible extremities without noticeable abnormality  PSYCH/NEURO: pleasant and cooperative, no obvious depression or anxiety, speech and thought processing grossly intact  ASSESSMENT AND PLAN:  Discussed the following assessment and plan:  Dysuria - Plan: Urinalysis, Routine w reflex microscopic, Culture, Urine We discussed differential Dx's. Monitor for new symptoms. She will go to the clinic and collect urine sample before starting abx treatment.  Urinary tract infection without hematuria, site unspecified - Plan: nitrofurantoin, macrocrystal-monohydrate, (MACROBID) 100 MG capsule, Culture, Urine Empiric abx treatment started today, Macrobid 100 mg bid x 5 days. Will follow Ucx and tailor treatment according to susceptibility report.  Clearly instructed about warning signs. F/U if symptoms persist.  She voices understanding and agrees with plan.  We discussed possible serious and likely etiologies, options for evaluation and workup, limitations of telemedicine visit vs in person visit, treatment, treatment risks and precautions.  I discussed the assessment and treatment plan with the patient. Sydney Hale was provided an opportunity to ask questions and all were answered. She agreed with the plan and demonstrated an understanding of the instructions.   Return if symptoms worsen or fail to improve.  Zhanna Melin [place], MD

## 2020-05-04 LAB — URINE CULTURE
MICRO NUMBER:: 11705654
SPECIMEN QUALITY:: ADEQUATE

## 2020-06-14 ENCOUNTER — Encounter: Payer: Self-pay | Admitting: Family Medicine

## 2020-06-14 ENCOUNTER — Other Ambulatory Visit (HOSPITAL_COMMUNITY)
Admission: RE | Admit: 2020-06-14 | Discharge: 2020-06-14 | Disposition: A | Discharge status: Home / Self Care | Payer: Managed Care, Other (non HMO) | Source: Ambulatory Visit | Attending: Family Medicine | Admitting: Family Medicine

## 2020-06-14 ENCOUNTER — Ambulatory Visit (INDEPENDENT_AMBULATORY_CARE_PROVIDER_SITE_OTHER): Payer: Managed Care, Other (non HMO) | Admitting: Family Medicine

## 2020-06-14 ENCOUNTER — Other Ambulatory Visit: Payer: Self-pay | Admitting: Family Medicine

## 2020-06-14 ENCOUNTER — Other Ambulatory Visit: Payer: Self-pay

## 2020-06-14 VITALS — BP 98/72 | HR 68 | Temp 98.3°F | Ht 69.0 in | Wt 206.6 lb

## 2020-06-14 DIAGNOSIS — G4700 Insomnia, unspecified: Secondary | ICD-10-CM

## 2020-06-14 DIAGNOSIS — Z131 Encounter for screening for diabetes mellitus: Secondary | ICD-10-CM

## 2020-06-14 DIAGNOSIS — L900 Lichen sclerosus et atrophicus: Secondary | ICD-10-CM

## 2020-06-14 DIAGNOSIS — Z0000 Encounter for general adult medical examination without abnormal findings: Secondary | ICD-10-CM | POA: Diagnosis not present

## 2020-06-14 DIAGNOSIS — Z124 Encounter for screening for malignant neoplasm of cervix: Secondary | ICD-10-CM | POA: Insufficient documentation

## 2020-06-14 DIAGNOSIS — R3129 Other microscopic hematuria: Secondary | ICD-10-CM | POA: Diagnosis not present

## 2020-06-14 DIAGNOSIS — Z13220 Encounter for screening for lipoid disorders: Secondary | ICD-10-CM

## 2020-06-14 LAB — COMPREHENSIVE METABOLIC PANEL
ALT: 22 U/L (ref 0–35)
AST: 17 U/L (ref 0–37)
Albumin: 4.5 g/dL (ref 3.5–5.2)
Alkaline Phosphatase: 58 U/L (ref 39–117)
BUN: 13 mg/dL (ref 6–23)
CO2: 27 mEq/L (ref 19–32)
Calcium: 10 mg/dL (ref 8.4–10.5)
Chloride: 104 mEq/L (ref 96–112)
Creatinine, Ser: 0.85 mg/dL (ref 0.40–1.20)
GFR: 80.05 mL/min (ref >60.00)
Glucose, Bld: 96 mg/dL (ref 70–99)
Potassium: 3.8 mEq/L (ref 3.5–5.1)
Sodium: 139 mEq/L (ref 135–145)
Total Bilirubin: 0.7 mg/dL (ref 0.2–1.2)
Total Protein: 7 g/dL (ref 6.0–8.3)

## 2020-06-14 LAB — CBC WITH DIFFERENTIAL/PLATELET
Basophils Absolute: 0 10*3/uL (ref 0.0–0.1)
Basophils Relative: 0.6 % (ref 0.0–3.0)
Eosinophils Absolute: 0.3 10*3/uL (ref 0.0–0.7)
Eosinophils Relative: 3.8 % (ref 0.0–5.0)
HCT: 40 % (ref 36.0–46.0)
Hemoglobin: 13.7 g/dL (ref 12.0–15.0)
Lymphocytes Relative: 39.2 % (ref 12.0–46.0)
Lymphs Abs: 3.4 10*3/uL (ref 0.7–4.0)
MCHC: 34.1 g/dL (ref 30.0–36.0)
MCV: 78.5 fl (ref 78.0–100.0)
Monocytes Absolute: 0.4 10*3/uL (ref 0.1–1.0)
Monocytes Relative: 5 % (ref 3.0–12.0)
Neutro Abs: 4.4 10*3/uL (ref 1.4–7.7)
Neutrophils Relative %: 51.4 % (ref 43.0–77.0)
Platelets: 248 10*3/uL (ref 150.0–400.0)
RBC: 5.1 Mil/uL (ref 3.87–5.11)
RDW: 14.1 % (ref 11.5–15.5)
WBC: 8.6 10*3/uL (ref 4.0–10.5)

## 2020-06-14 LAB — LIPID PANEL
Cholesterol: 224 mg/dL — ABNORMAL HIGH (ref 0–200)
HDL: 42.6 mg/dL (ref >39.00)
LDL Cholesterol: 142 mg/dL — ABNORMAL HIGH (ref 0–99)
NonHDL: 180.97
Total CHOL/HDL Ratio: 5
Triglycerides: 193 mg/dL — ABNORMAL HIGH (ref 0.0–149.0)
VLDL: 38.6 mg/dL (ref 0.0–40.0)

## 2020-06-14 MED ORDER — TRIAMCINOLONE ACETONIDE 0.1 % EX CREA: 1.0000 "application " | TOPICAL_CREAM | Freq: Two times a day (BID) | CUTANEOUS | 0 refills | Status: DC

## 2020-06-14 MED ORDER — LORAZEPAM 0.5 MG PO TABS: 0.5000 mg | ORAL_TABLET | Freq: Every evening | ORAL | 0 refills | Status: DC | PRN

## 2020-06-14 NOTE — Progress Notes (Signed)
Sydney Hale DOB: 15-Mar-1970 Encounter date: 06/14/2020  This is a 50 y.o. 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 Increased stress with planning for this. Increased anxiety.   Thinks menopausal - not sleeping well. Falling asleep and staying asleep are both issues. Some sweating. Just waking more. Has taking melatonin; has tried PM stuff - but feels groggy next day. Has tried half benadryl - but doesn't always work.   Did see gyn a few years ago; but not sure who it was. Has mammogram June 2nd.  Has had some irritation in past. Was using steroid for what she was told was lichen sclerosis - steroid cream did help, but has been out for some time.   Some occasional vertigo - has had this for years; just waits it out. Medication makes her feel worse.   Past Medical History:  Diagnosis Date  . Abdominal pain, RLQ 10/22/2012  . Hemorrhagic ovarian cyst 10/23/2012  . PONV (postoperative nausea and vomiting)   . S/P endometrial ablation   . S/P tubal ligation   . Thyroid disease    thyroid nodule  . UTI (urinary tract infection)    Past Surgical History:  Procedure Laterality Date  . CESAREAN SECTION     x3  . COLONOSCOPY     around 2008. Was having rectal bleeding. In [place]  . INCISION AND DRAINAGE BREAST ABSCESS  2000  . TUBAL LIGATION     Allergies  Allergen Reactions  . Erythromycin Other (See Comments)    Unknown, was told by mother  . Hydrocodone     Nausea and vomiting   Current Meds  Medication Sig  . acetaminophen (TYLENOL) 500 MG tablet Take 500 mg by mouth every 6 (six) hours as needed.  Marland Kitchen CLODERM 0.1 % cream Apply 1 application topically as needed.   . hydrocortisone (ANUSOL-HC) 2.5 % rectal cream HC 2.5% BID x 10 days apply perianal area.  Marland Kitchen ibuprofen (ADVIL,MOTRIN) 200 MG tablet Take 600 mg by mouth every 6 (six) hours  as needed.  Marland Kitchen LORazepam (ATIVAN) 0.5 MG tablet Take 1 tablet (0.5 mg total) by mouth at bedtime as needed for anxiety or sleep.  Marland Kitchen triamcinolone cream (KENALOG) 0.1 % Apply 1 application topically 2 (two) times daily.   Social History   Tobacco Use  . Smoking status: Never Smoker  . Smokeless tobacco: Never Used  Substance Use Topics  . Alcohol use: Yes    Comment: ocassionally   Family History  Problem Relation Age of Onset  . Hyperlipidemia Mother   . Diabetes Mother   . CAD Mother 17  . Heart attack Mother 51       previous stent re-occluded  . Heart disease Mother   . Hyperlipidemia Father   . Bladder Cancer Father        tobacco abuse  . Colon polyps Father   . Epilepsy Father   . Seizures Father   . Cancer Paternal Grandmother        colon?  . Other Sister        hyperglycemia  . High Cholesterol Sister   . Epilepsy Brother   . Melanoma Maternal Grandmother 60  . Colon cancer Maternal Grandmother   . Other Maternal Grandfather        no family history known  . Other Paternal Grandfather  car accident  . Alcohol abuse Brother        esoph varices  . Esophageal cancer Neg Hx      Review of Systems  Constitutional: Negative for activity change, appetite change, chills, fatigue, fever and unexpected weight change.  HENT: Negative for congestion, ear pain, hearing loss, sinus pressure, sinus pain, sore throat and trouble swallowing.   Eyes: Negative for pain and visual disturbance.  Respiratory: Negative for cough, chest tightness, shortness of breath and wheezing.   Cardiovascular: Negative for chest pain, palpitations and leg swelling.  Gastrointestinal: Negative for abdominal pain, blood in stool, constipation, diarrhea, nausea and vomiting.  Genitourinary: Negative for difficulty urinating and menstrual problem.       Has been dx with lichen sclerosis; states that it is flared now. It had been well controlled with clobetasol that she was given by gyn in  the past, but she ran out.  Musculoskeletal: Negative for arthralgias and back pain.  Skin: Negative for rash.  Neurological: Negative for dizziness, weakness, numbness and headaches.  Hematological: Negative for adenopathy. Does not bruise/bleed easily.  Psychiatric/Behavioral: Positive for sleep disturbance. Negative for suicidal ideas. The patient is nervous/anxious (has some increased stressors on plate with going back to school on top of other responsibilities).     CBC:  Lab Results  Component Value Date   WBC 11.0 (H) 12/07/2018   HGB 13.8 12/07/2018   HCT 41.3 12/07/2018   MCH 26.1 10/23/2012   MCHC 33.3 12/07/2018   RDW 14.0 12/07/2018   PLT 281.0 12/07/2018   CMP: Lab Results  Component Value Date   NA 138 12/07/2018   K 4.3 12/07/2018   CL 102 12/07/2018   CO2 26 12/07/2018   GLUCOSE 85 12/07/2018   BUN 13 12/07/2018   CREATININE 0.75 12/07/2018   GFRAA >90 10/22/2012   CALCIUM 9.9 12/07/2018   PROT 7.1 12/07/2018   BILITOT 0.3 12/07/2018   ALKPHOS 53 12/07/2018   ALT 15 12/07/2018   AST 14 12/07/2018   LIPID: Lab Results  Component Value Date   CHOL 164 10/28/2017   TRIG 111.0 10/28/2017   HDL 31.00 (L) 10/28/2017   LDLCALC 111 (H) 10/28/2017    Objective:  BP 98/72 (BP Location: Left Arm, Patient Position: Sitting, Cuff Size: Large)   Pulse 68   Temp 98.3 F (36.8 C) (Oral)   Ht 5\' 9"  (1.753 m)   Wt 206 lb 9.6 oz (93.7 kg)   LMP 04/04/2020 (Approximate)   SpO2 98%   BMI 30.51 kg/m   Weight: 206 lb 9.6 oz (93.7 kg)   BP Readings from Last 3 Encounters:  06/14/20 98/72  01/18/20 110/72  02/08/19 102/70   Wt Readings from Last 3 Encounters:  06/14/20 206 lb 9.6 oz (93.7 kg)  01/18/20 219 lb (99.3 kg)  02/08/19 212 lb (96.2 kg)    Physical Exam Exam conducted with a chaperone present.  Constitutional:      General: She is not in acute distress.    Appearance: She is well-developed.  HENT:     Head: Normocephalic and atraumatic.      Right Ear: External ear normal.     Left Ear: External ear normal.     Mouth/Throat:     Pharynx: No oropharyngeal exudate.  Eyes:     Conjunctiva/sclera: Conjunctivae normal.     Pupils: Pupils are equal, round, and reactive to light.  Neck:     Thyroid: No thyromegaly.  Cardiovascular:  Rate and Rhythm: Normal rate and regular rhythm.     Heart sounds: Normal heart sounds. No murmur heard. No friction rub. No gallop.   Pulmonary:     Effort: Pulmonary effort is normal.     Breath sounds: Normal breath sounds.  Abdominal:     General: Bowel sounds are normal. There is no distension.     Palpations: Abdomen is soft. There is no mass.     Tenderness: There is no abdominal tenderness. There is no guarding.     Hernia: No hernia is present.  Genitourinary:    Exam position: Supine.     Labia:        Right: Rash present.        Left: Rash present.      Vagina: Normal.     Cervix: Normal.     Uterus: Normal.      Adnexa: Right adnexa normal and left adnexa normal.       Comments: Erythema/hypopigmentation over labia, perenium and down to anus.  Musculoskeletal:        General: No tenderness or deformity. Normal range of motion.     Cervical back: Normal range of motion and neck supple.  Lymphadenopathy:     Cervical: No cervical adenopathy.     Lower Body: No right inguinal adenopathy. No left inguinal adenopathy.  Skin:    General: Skin is warm and dry.     Findings: No rash.  Neurological:     Mental Status: She is alert and oriented to person, place, and time.     Deep Tendon Reflexes: Reflexes normal.     Reflex Scores:      Tricep reflexes are 2+ on the right side and 2+ on the left side.      Bicep reflexes are 2+ on the right side and 2+ on the left side.      Brachioradialis reflexes are 2+ on the right side and 2+ on the left side.      Patellar reflexes are 2+ on the right side and 2+ on the left side. Psychiatric:        Speech: Speech normal.         Behavior: Behavior normal.        Thought Content: Thought content normal.     Assessment/Plan: There are no preventive care reminders to display for this patient. Health Maintenance reviewed - up to date.  1. Preventative health care She needs bloodwork for nursing school; this was ordered today and paperwork completed for her for schooling.  - QuantiFERON-TB Gold Plus; Future - Mumps antibody, IgG; Future - Mumps antibody, IgG - QuantiFERON-TB Gold Plus  2. Insomnia, unspecified type We discussed treatment options and consideration for long acting melatonin since she gets some relief with regular release. She has tolerated ativan in remote past, and we gave a small amount to try and get her back on track with sleep cycle. She is aware of good sleep hygiene techniques and will continue to work on that consistency.  3. Other microscopic hematuria Noted on recent UTI evaluation at outside clinic. Will recheck today. - CBC with Differential/Platelet; Future - Urinalysis with Culture Reflex; Future - CBC with Differential/Platelet - Urinalysis with Culture Reflex  4. Lipid screening - Comprehensive metabolic panel; Future - Lipid panel; Future - Comprehensive metabolic panel - Lipid panel  5. Screening for diabetes mellitus labwork today  6. Lichen sclerosus Extensive rash. I did refill clobetasone for her; although we discused that this  condition can sometimes be controlled with lesser strength steroid. I encouraged her to follow up with gyn and we discussed that there is increased risk of cancer with this condition, so she is willing to do so.   7. Cervical cancer screening - PAP [[place]]  Return in about 6 months (around 12/15/2020) for Chronic condition visit.  Micheline Rough, MD

## 2020-06-16 LAB — QUANTIFERON-TB GOLD PLUS
Mitogen-NIL: 6.15 IU/mL
NIL: 0.05 IU/mL
QuantiFERON-TB Gold Plus: NEGATIVE
TB1-NIL: 0.03 IU/mL
TB2-NIL: 0 IU/mL

## 2020-06-16 LAB — HOUSE ACCOUNT TRACKING

## 2020-06-16 LAB — DUPLICATE REPORT

## 2020-06-18 ENCOUNTER — Other Ambulatory Visit: Payer: Self-pay | Admitting: Family Medicine

## 2020-06-19 ENCOUNTER — Encounter: Payer: Self-pay | Admitting: Family Medicine

## 2020-06-19 LAB — CYTOLOGY - PAP
Comment: NEGATIVE
Diagnosis: NEGATIVE
High risk HPV: NEGATIVE

## 2020-06-19 NOTE — Telephone Encounter (Signed)
See results note regarding the MMR.  Patient was informed she will be contacted once the results are reviewed by the PCP. 

## 2020-06-20 LAB — QUANTIFERON-TB GOLD PLUS

## 2020-06-20 LAB — URINALYSIS W MICROSCOPIC + REFLEX CULTURE
Bacteria, UA: NONE SEEN /HPF
Bilirubin Urine: NEGATIVE
Glucose, UA: NEGATIVE
Hgb urine dipstick: NEGATIVE
Hyaline Cast: NONE SEEN /LPF
Ketones, ur: NEGATIVE
Nitrites, Initial: NEGATIVE
Protein, ur: NEGATIVE
Specific Gravity, Urine: 1.019 (ref 1.001–1.035)
pH: <=5 (ref 5.0–8.0)

## 2020-06-20 LAB — URINE CULTURE
MICRO NUMBER:: 11879436
Result:: NO GROWTH
SPECIMEN QUALITY:: ADEQUATE

## 2020-06-20 LAB — MUMPS ANTIBODY, IGG: Mumps IgG: <9 AU/mL — ABNORMAL LOW

## 2020-06-20 LAB — CULTURE INDICATED

## 2020-06-22 ENCOUNTER — Ambulatory Visit: Payer: Managed Care, Other (non HMO)

## 2020-06-23 ENCOUNTER — Other Ambulatory Visit: Payer: Self-pay

## 2020-06-23 ENCOUNTER — Ambulatory Visit: Payer: Managed Care, Other (non HMO)

## 2020-07-20 LAB — HM MAMMOGRAPHY

## 2020-08-02 ENCOUNTER — Encounter: Payer: Self-pay | Admitting: Family Medicine

## 2020-09-24 ENCOUNTER — Other Ambulatory Visit: Payer: Self-pay | Admitting: Family Medicine

## 2021-02-02 ENCOUNTER — Telehealth: Payer: Self-pay | Admitting: Family Medicine

## 2021-02-02 NOTE — Telephone Encounter (Signed)
Patient is requesting medication for UTI. She stated that she get these twice every year. I informed her that she would most likely need an appointment but no one within [place] had any availability today.  Patient could be contacted at [phone].  Please advise.

## 2021-02-02 NOTE — Telephone Encounter (Signed)
Spoke with the patient and informed her a visit is needed for evaluation.  Advised the patient none of our providers have any openings and she can go to an urgent care of her choice, or try going to the [facility] website and she could try scheduling a virtual visit via the website.

## 2021-04-11 ENCOUNTER — Telehealth: Payer: Self-pay | Admitting: Family Medicine

## 2021-04-11 NOTE — Telephone Encounter (Signed)
ok 

## 2021-04-11 NOTE — Telephone Encounter (Signed)
Pt states Dr.Koberlein is leaving the practice, and she is hoping to transfer to [place]. Please advise.  ?

## 2021-05-29 ENCOUNTER — Encounter: Payer: Self-pay | Admitting: Family

## 2021-05-29 ENCOUNTER — Ambulatory Visit: Payer: BC Managed Care – PPO | Admitting: Family

## 2021-05-29 VITALS — BP 120/70 | HR 61 | Temp 98.1°F | Ht 70.0 in | Wt 207.0 lb

## 2021-05-29 DIAGNOSIS — L900 Lichen sclerosus et atrophicus: Secondary | ICD-10-CM | POA: Diagnosis not present

## 2021-05-29 DIAGNOSIS — Z8249 Family history of ischemic heart disease and other diseases of the circulatory system: Secondary | ICD-10-CM | POA: Diagnosis not present

## 2021-05-29 DIAGNOSIS — M542 Cervicalgia: Secondary | ICD-10-CM | POA: Diagnosis not present

## 2021-05-29 MED ORDER — IBUPROFEN 800 MG PO TABS: 800.0000 mg | ORAL_TABLET | Freq: Three times a day (TID) | ORAL | 0 refills | Status: DC | PRN

## 2021-05-29 MED ORDER — METHOCARBAMOL 500 MG PO TABS
500.0000 mg | ORAL_TABLET | Freq: Every evening | ORAL | 0 refills | Status: DC | PRN
Start: 2021-05-29 — End: 2021-08-16
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
## 2021-06-26 NOTE — Progress Notes (Deleted)
Referring-Laura Scheryl Darter, FNP Reason for referral-family history of coronary artery disease  HPI: 51 year old female for evaluation of family history of coronary artery disease at request of Marrian Salvage, [place].  Holter monitor May 2018 showed no significant arrhythmias.  Current Outpatient Medications  Medication Sig Dispense Refill   acetaminophen (TYLENOL) 500 MG tablet Take 500 mg by mouth every 6 (six) hours as needed.     CLODERM 0.1 % cream Apply 1 application topically as needed.      hydrocortisone (ANUSOL-HC) 2.5 % rectal cream HC 2.5% BID x 10 days apply perianal area. 30 g 2   ibuprofen (ADVIL) 800 MG tablet Take 1 tablet (800 mg total) by mouth every 8 (eight) hours as needed. 60 tablet 0   ibuprofen (ADVIL,MOTRIN) 200 MG tablet Take 600 mg by mouth every 6 (six) hours as needed.     LORazepam (ATIVAN) 0.5 MG tablet TAKE 1 TABLET BY MOUTH AT BEDTIME AS NEEDED FOR ANXIETY OR SLEEP 10 tablet 0   methocarbamol (ROBAXIN) 500 MG tablet Take 1 tablet (500 mg total) by mouth at bedtime as needed. 15 tablet 0   triamcinolone cream (KENALOG) 0.1 % Apply 1 application topically 2 (two) times daily. 30 g 0   No current facility-administered medications for this visit.    Allergies  Allergen Reactions   Erythromycin Other (See Comments)    Unknown, was told by mother   Hydrocodone     Nausea and vomiting   Other Rash    Nickel metals.      Past Medical History:  Diagnosis Date   Abdominal pain, RLQ 10/22/2012   Hemorrhagic ovarian cyst 10/23/2012   PONV (postoperative nausea and vomiting)    S/P endometrial ablation    S/P tubal ligation    Thyroid disease    thyroid nodule   UTI (urinary tract infection)     Past Surgical History:  Procedure Laterality Date   CESAREAN SECTION     x3   COLONOSCOPY     around 2008. Was having rectal bleeding. In [place]  2000   TUBAL LIGATION      Social History    Socioeconomic History   Marital status: Married    Spouse name: Not on file   Number of children: 4   Years of education: Not on file   Highest education level: Not on file  Occupational History   Occupation: Nurse    Comment: Hospice nurse   Tobacco Use   Smoking status: Never   Smokeless tobacco: Never  Vaping Use   Vaping Use: Never used  Substance and Sexual Activity   Alcohol use: Yes    Comment: ocassionally   Drug use: No   Sexual activity: Yes    Birth control/protection: None  Other Topics Concern   Not on file  Social History Narrative   Not on file   Social Determinants of Health   Financial Resource Strain: Not on file  Food Insecurity: Not on file  Transportation Needs: Not on file  Physical Activity: Not on file  Stress: Not on file  Social Connections: Not on file  Intimate Partner Violence: Not on file    Family History  Problem Relation Age of Onset   Hyperlipidemia Mother    Diabetes Mother    CAD Mother 25   Heart attack Mother 74       previous stent re-occluded   Heart disease Mother  Hyperlipidemia Father    Bladder Cancer Father        tobacco abuse   Colon polyps Father    Epilepsy Father    Seizures Father    Other Sister        hyperglycemia   High Cholesterol Sister    Epilepsy Brother    Alcohol abuse Brother        esoph varices   Cancer Maternal Grandmother    Melanoma Maternal Grandmother 11   Colon cancer Maternal Grandmother    Other Maternal Grandfather        no family history known   Cancer Paternal Grandmother        colon?   Alcohol abuse Paternal Grandfather    Other Paternal Grandfather        car accident   Esophageal cancer Neg Hx     ROS: no fevers or chills, productive cough, hemoptysis, dysphasia, odynophagia, melena, hematochezia, dysuria, hematuria, rash, seizure activity, orthopnea, PND, pedal edema, claudication. Remaining systems are negative.  Physical Exam:   There were no vitals taken  for this visit.  General:  Well developed/well nourished in NAD Skin warm/dry Patient not depressed No peripheral clubbing Back-normal HEENT-normal/normal eyelids Neck supple/normal carotid upstroke bilaterally; no bruits; no JVD; no thyromegaly chest - CTA/ normal expansion CV - RRR/normal S1 and S2; no murmurs, rubs or gallops;  PMI nondisplaced Abdomen -NT/ND, no HSM, no mass, + bowel sounds, no bruit 2+ femoral pulses, no bruits Ext-no edema, chords, 2+ DP Neuro-grossly nonfocal  ECG - personally reviewed  A/P  1 family history of coronary artery disease-we will plan to risk stratify with a calcium score.  Kirk Ruths, MD

## 2021-07-04 ENCOUNTER — Ambulatory Visit: Payer: BC Managed Care – PPO | Admitting: Cardiology

## 2021-07-10 NOTE — Progress Notes (Signed)
Referring-Laura Scheryl Darter, FNP Reason for referral-family history of coronary artery disease and chest pain  HPI: 51 year old female for evaluation of family history of coronary artery disease and chest pain at request of Marrian Salvage, [place].  Monitor May 2018 showed sinus rhythm with occasional PVC.  Patient's mother had her first stent at age 69 and has a significant cardiac history.  Patient has dyspnea with more vigorous activities but not with routine activities.  No orthopnea, PND, pedal edema, or syncope.  Occasional brief palpitations.  She has a mild chest discomfort when she initiates activities that improves with continued exertion.  No radiation.  Cardiology now asked to evaluate.  Current Outpatient Medications  Medication Sig Dispense Refill   acetaminophen (TYLENOL) 500 MG tablet Take 500 mg by mouth every 6 (six) hours as needed. (Patient not taking: Reported on 07/13/2021)     CLODERM 0.1 % cream Apply 1 application topically as needed.  (Patient not taking: Reported on 07/13/2021)     hydrocortisone (ANUSOL-HC) 2.5 % rectal cream HC 2.5% BID x 10 days apply perianal area. (Patient not taking: Reported on 07/13/2021) 30 g 2   ibuprofen (ADVIL) 800 MG tablet Take 1 tablet (800 mg total) by mouth every 8 (eight) hours as needed. (Patient not taking: Reported on 07/13/2021) 60 tablet 0   ibuprofen (ADVIL,MOTRIN) 200 MG tablet Take 600 mg by mouth every 6 (six) hours as needed. (Patient not taking: Reported on 07/13/2021)     LORazepam (ATIVAN) 0.5 MG tablet TAKE 1 TABLET BY MOUTH AT BEDTIME AS NEEDED FOR ANXIETY OR SLEEP (Patient not taking: Reported on 07/13/2021) 10 tablet 0   methocarbamol (ROBAXIN) 500 MG tablet Take 1 tablet (500 mg total) by mouth at bedtime as needed. (Patient not taking: Reported on 07/13/2021) 15 tablet 0   triamcinolone cream (KENALOG) 0.1 % Apply 1 application topically 2 (two) times daily. (Patient not taking: Reported on 07/13/2021) 30 g 0   No current  facility-administered medications for this visit.    Allergies  Allergen Reactions   Erythromycin Other (See Comments)    Unknown, was told by mother   Hydrocodone     Nausea and vomiting   Other Rash    Nickel metals.      Past Medical History:  Diagnosis Date   Abdominal pain, RLQ 10/22/2012   Hemorrhagic ovarian cyst 10/23/2012   PONV (postoperative nausea and vomiting)    S/P endometrial ablation    S/P tubal ligation    Thyroid disease    thyroid nodule   UTI (urinary tract infection)     Past Surgical History:  Procedure Laterality Date   CESAREAN SECTION     x3   COLONOSCOPY     around 2008. Was having rectal bleeding. In [place]  2000   TUBAL LIGATION      Social History   Socioeconomic History   Marital status: Married    Spouse name: Not on file   Number of children: 4   Years of education: Not on file   Highest education level: Not on file  Occupational History   Occupation: Nurse    Comment: Hospice nurse   Tobacco Use   Smoking status: Never   Smokeless tobacco: Never  Vaping Use   Vaping Use: Never used  Substance and Sexual Activity   Alcohol use: Yes    Comment: occasionally   Drug use: No   Sexual activity: Yes  Birth control/protection: None  Other Topics Concern   Not on file  Social History Narrative   Not on file   Social Determinants of Health   Financial Resource Strain: Not on file  Food Insecurity: Not on file  Transportation Needs: Not on file  Physical Activity: Not on file  Stress: Not on file  Social Connections: Not on file  Intimate Partner Violence: Not on file    Family History  Problem Relation Age of Onset   Hyperlipidemia Mother    Diabetes Mother    CAD Mother 74   Heart attack Mother 67       previous stent re-occluded   Heart disease Mother    Hyperlipidemia Father    Bladder Cancer Father        tobacco abuse   Colon polyps Father    Epilepsy Father     Seizures Father    Hypertension Sister    Other Sister        hyperglycemia   High Cholesterol Sister    Epilepsy Brother    Alcohol abuse Brother        esoph varices   Cancer Maternal Grandmother    Melanoma Maternal Grandmother 93   Colon cancer Maternal Grandmother    Other Maternal Grandfather        no family history known   Cancer Paternal Grandmother        colon?   Alcohol abuse Paternal Grandfather    Other Paternal Grandfather        car accident   Esophageal cancer Neg Hx     ROS: no fevers or chills, productive cough, hemoptysis, dysphasia, odynophagia, melena, hematochezia, dysuria, hematuria, rash, seizure activity, orthopnea, PND, pedal edema, claudication. Remaining systems are negative.  Physical Exam:   Blood pressure 115/79, pulse 69, height '5\' 10"'$  (1.778 m), weight 212 lb 12.8 oz (96.5 kg), SpO2 97 %.  General:  Well developed/well nourished in NAD Skin warm/dry Patient not depressed No peripheral clubbing Back-normal HEENT-normal/normal eyelids Neck supple/normal carotid upstroke bilaterally; no bruits; no JVD; no thyromegaly chest - CTA/ normal expansion CV - RRR/normal S1 and S2; no murmurs, rubs or gallops;  PMI nondisplaced Abdomen -NT/ND, no HSM, no mass, + bowel sounds, no bruit 2+ femoral pulses, no bruits Ext-no edema, chords, 2+ DP Neuro-grossly nonfocal  ECG -normal sinus rhythm at a rate of 68, nonspecific anterior T wave changes.  Personally reviewed  A/P  1 chest pain-symptoms are atypical.  However she has a strong family history as well as hyperlipidemia on previous laboratories.  We will arrange a cardiac CTA to exclude obstructive coronary disease.  2 family history of coronary artery disease-as outlined above we are ruling out coronary disease.  3 hyperlipidemia-previous LDL elevated.  We will repeat cholesterol.  If significantly elevated or any obstructive coronary disease/calcium on her cardiac CTA will initiate statin  therapy.  Kirk Ruths, MD

## 2021-07-13 ENCOUNTER — Ambulatory Visit: Payer: BC Managed Care – PPO | Admitting: Cardiology

## 2021-07-13 ENCOUNTER — Encounter: Payer: Self-pay | Admitting: Cardiology

## 2021-07-13 VITALS — BP 115/79 | HR 69 | Ht 70.0 in | Wt 212.8 lb

## 2021-07-13 DIAGNOSIS — E7800 Pure hypercholesterolemia, unspecified: Secondary | ICD-10-CM | POA: Diagnosis not present

## 2021-07-13 DIAGNOSIS — R072 Precordial pain: Secondary | ICD-10-CM | POA: Diagnosis not present

## 2021-07-13 MED ORDER — METOPROLOL TARTRATE 100 MG PO TABS
ORAL_TABLET | ORAL | 0 refills | Status: DC
Start: 2021-07-13 — End: 2021-08-16

## 2021-07-13 NOTE — Patient Instructions (Signed)
  Testing/Procedures:    Your cardiac CT will be scheduled at   [facility] [place], [place] [address] [phone]   If scheduled at [facility], please arrive at the [facility] and Children's Entrance (Entrance C2) of [facility] 30 minutes prior to test start time. You can use the FREE valet parking offered at entrance C (encouraged to control the heart rate for the test)  Proceed to the [facility] Radiology Department (first floor) to check-in and test prep.  All radiology patients and guests should use entrance C2 at [facility], accessed from [facility], even though the hospital's physical address listed is [address]      Please follow these instructions carefully (unless otherwise directed):    On the Night Before the Test: Be sure to Drink plenty of water. Do not consume any caffeinated/decaffeinated beverages or chocolate 12 hours prior to your test. Do not take any antihistamines 12 hours prior to your test.   On the Day of the Test: Drink plenty of water until 1 hour prior to the test. Do not eat any food 4 hours prior to the test. You may take your regular medications prior to the test.  Take metoprolol (Lopressor) 100 MG two hours prior to test. FEMALES- please wear underwire-free bra if available, avoid dresses & tight clothing   After the Test: Drink plenty of water. After receiving IV contrast, you may experience a mild flushed feeling. This is normal. On occasion, you may experience a mild rash up to 24 hours after the test. This is not dangerous. If this occurs, you can take Benadryl 25 mg and increase your fluid intake. If you experience trouble breathing, this can be serious. If it is severe call 911 IMMEDIATELY. If it is mild, please call our office.   We will call to schedule your test 2-4 weeks out understanding that some insurance companies will need an authorization  prior to the service being performed.   For non-scheduling related questions, please contact the cardiac imaging nurse navigator should you have any questions/concerns: Marchia Bond, Cardiac Imaging Nurse Navigator Gordy Clement, Cardiac Imaging Nurse Navigator [facility] Direct Office Dial: [phone]   For scheduling needs, including cancellations and rescheduling, please call [place], [phone].    Follow-Up: At [facility], you and your health needs are our priority.  As part of our continuing mission to provide you with exceptional heart care, we have created designated Provider Care Teams.  These Care Teams include your primary Cardiologist (physician) and Advanced Practice Providers (APPs -  Physician Assistants and Nurse Practitioners) who all work together to provide you with the care you need, when you need it.  We recommend signing up for the patient portal called "MyChart".  Sign up information is provided on this After Visit Summary.  MyChart is used to connect with patients for Virtual Visits (Telemedicine).  Patients are able to view lab/test results, encounter notes, upcoming appointments, etc.  Non-urgent messages can be sent to your provider as well.   To learn more about what you can do with MyChart, go to [email or web address].    Your next appointment:   12 month(s)  The format for your next appointment:   In Person  Provider:   Kirk Ruths MD      Important Information About Sugar

## 2021-07-31 DIAGNOSIS — R072 Precordial pain: Secondary | ICD-10-CM | POA: Diagnosis not present

## 2021-07-31 DIAGNOSIS — E7800 Pure hypercholesterolemia, unspecified: Secondary | ICD-10-CM | POA: Diagnosis not present

## 2021-07-31 LAB — LIPID PANEL
Chol/HDL Ratio: 5 ratio — ABNORMAL HIGH (ref 0.0–4.4)
Cholesterol, Total: 208 mg/dL — ABNORMAL HIGH (ref 100–199)
HDL: 42 mg/dL (ref >39)
LDL Chol Calc (NIH): 146 mg/dL — ABNORMAL HIGH (ref 0–99)
Triglycerides: 111 mg/dL (ref 0–149)
VLDL Cholesterol Cal: 20 mg/dL (ref 5–40)

## 2021-08-01 ENCOUNTER — Other Ambulatory Visit: Payer: Self-pay | Admitting: *Deleted

## 2021-08-01 DIAGNOSIS — E7800 Pure hypercholesterolemia, unspecified: Secondary | ICD-10-CM

## 2021-08-01 MED ORDER — ROSUVASTATIN CALCIUM 20 MG PO TABS: 20.0000 mg | ORAL_TABLET | Freq: Every day | ORAL | 3 refills | Status: DC

## 2021-08-02 ENCOUNTER — Telehealth (HOSPITAL_COMMUNITY): Payer: Self-pay | Admitting: *Deleted

## 2021-08-02 NOTE — Telephone Encounter (Signed)
Reaching out to patient to offer assistance regarding upcoming cardiac imaging study; pt verbalizes understanding of appt date/time, parking situation and where to check in, pre-test NPO status and medications ordered, and verified current allergies; name and call back number provided for further questions should they arise  Gordy Clement RN Navigator Cardiac Imaging [facility] [phone] office [phone] cell  Patient to take '50mg'$  metoprolol tartrate two hours prior to her cardiac CT scan if her HR is greater than 65bpm. She is aware to arrive at 9am.

## 2021-08-06 ENCOUNTER — Ambulatory Visit (HOSPITAL_COMMUNITY)
Admission: RE | Admit: 2021-08-06 | Discharge: 2021-08-06 | Disposition: A | Discharge status: Home / Self Care | Payer: BC Managed Care – PPO | Source: Ambulatory Visit | Attending: Cardiology | Admitting: Cardiology

## 2021-08-06 ENCOUNTER — Ambulatory Visit (HOSPITAL_BASED_OUTPATIENT_CLINIC_OR_DEPARTMENT_OTHER)
Admission: RE | Admit: 2021-08-06 | Discharge: 2021-08-06 | Disposition: A | Discharge status: Home / Self Care | Payer: BC Managed Care – PPO | Source: Ambulatory Visit | Attending: Cardiology | Admitting: Cardiology

## 2021-08-06 ENCOUNTER — Encounter (HOSPITAL_COMMUNITY): Payer: Self-pay

## 2021-08-06 ENCOUNTER — Other Ambulatory Visit: Payer: Self-pay | Admitting: Cardiology

## 2021-08-06 DIAGNOSIS — R931 Abnormal findings on diagnostic imaging of heart and coronary circulation: Secondary | ICD-10-CM

## 2021-08-06 DIAGNOSIS — I2510 Atherosclerotic heart disease of native coronary artery without angina pectoris: Secondary | ICD-10-CM | POA: Diagnosis not present

## 2021-08-06 DIAGNOSIS — R072 Precordial pain: Secondary | ICD-10-CM | POA: Diagnosis not present

## 2021-08-06 MED ORDER — NITROGLYCERIN 0.4 MG SL SUBL
SUBLINGUAL_TABLET | SUBLINGUAL | Status: AC
  Filled 2021-08-06: qty 2

## 2021-08-06 MED ORDER — NITROGLYCERIN 0.4 MG SL SUBL
0.8000 mg | SUBLINGUAL_TABLET | Freq: Once | SUBLINGUAL | Status: AC
  Administered 2021-08-06: 0.8 mg via SUBLINGUAL

## 2021-08-06 MED ORDER — IOHEXOL 350 MG/ML SOLN
100.0000 mL | Freq: Once | INTRAVENOUS | Status: AC | PRN
  Administered 2021-08-06: 100 mL via INTRAVENOUS

## 2021-08-10 ENCOUNTER — Telehealth: Payer: Self-pay | Admitting: Cardiology

## 2021-08-10 MED ORDER — ASPIRIN 81 MG PO TBEC: 81.0000 mg | DELAYED_RELEASE_TABLET | Freq: Every day | ORAL | 3 refills | Status: AC

## 2021-08-10 NOTE — Telephone Encounter (Signed)
Patient made aware of results and verbalized understanding.  FFR not suggestive of significant obstruction; make sure patient has fu elective appt. Add ASA 81 mg daily. Kirk Ruths

## 2021-08-10 NOTE — Telephone Encounter (Signed)
Patient was returning call. Please advise ?

## 2021-08-16 ENCOUNTER — Ambulatory Visit: Payer: BC Managed Care – PPO | Admitting: Family Medicine

## 2021-08-16 ENCOUNTER — Encounter: Payer: Self-pay | Admitting: Family Medicine

## 2021-08-16 VITALS — BP 127/67 | HR 64 | Ht 70.0 in | Wt 208.0 lb

## 2021-08-16 DIAGNOSIS — N904 Leukoplakia of vulva: Secondary | ICD-10-CM

## 2021-08-16 MED ORDER — CLOBETASOL PROPIONATE 0.05 % EX OINT: TOPICAL_OINTMENT | Freq: Every day | CUTANEOUS | 6 refills | Status: DC

## 2021-08-16 NOTE — Progress Notes (Signed)
   Subjective:    Patient ID: Sydney Hale, female    DOB: Jul 20, 1970, 51 y.o.   MRN: [id number]  HPI  Patient seen to establish care.  She has a history of lichen sclerosis, that was diagnosed a few years ago.  At that time, she started high potency steroid creams and has been using them intermittently until she few months ago.  Off the high potency steroids, her symptoms wax and wane in the vulvar area down to the anus.  She does have a fair amount of itching and pain with intercourse.  Patient is perimenopausal with her last menses back in January.  She is monogamous with her husband.  Review of Systems     Objective:   Physical Exam Vitals reviewed. Exam conducted with a chaperone present.  Constitutional:      Appearance: Normal appearance.  Genitourinary:    Comments: Patient has significant erythema with white border on her vulva, down her perineum and around the anus.  The area is fairly sensitive to palpation.  The labia minora has classic cigarette paper like appearance of lichen sclerosis Neurological:     Mental Status: She is alert.  Psychiatric:        Mood and Affect: Mood normal.        Behavior: Behavior normal.        Thought Content: Thought content normal.       Assessment & Plan:  1. Lichen sclerosus of female genitalia Discussed treatment at this time would be daily use of clobetasol or other high potency steroid cream.  Do not feel biopsy is indicated at this time due to its classic appearance.  I recommended daily use for the next 3 months, at which time we will reevaluate.  If her symptoms are controlled at that point, will decrease frequency of use until at maintenance regimen.  We discussed that there is no cure for lichen sclerosis.  Keeping symptoms under control is dependent upon maintenance regimens of steroids.

## 2021-08-16 NOTE — Progress Notes (Signed)
Patient states that she is here for evaluation of lichen sclerosis. Last pap May 2022. Anderson Malta [facility]

## 2021-08-27 ENCOUNTER — Telehealth: Payer: Self-pay | Admitting: General Practice

## 2021-08-27 NOTE — Telephone Encounter (Signed)
Left a message on VM asking pt to contact our office to schedule 3 month follow up in October with Dr. Nehemiah Settle.

## 2021-08-27 NOTE — Telephone Encounter (Signed)
-----   Message from Maurine Minister, [place] sent at 08/16/2021  2:18 PM EDT ----- Regarding: Follow Up Return in about 3 months (around 53/69/2230) for f/u lichen sclerosis with Dr. Nehemiah Settle.

## 2021-09-19 ENCOUNTER — Ambulatory Visit: Payer: BC Managed Care – PPO | Admitting: Cardiology

## 2021-10-17 ENCOUNTER — Encounter: Payer: Self-pay | Admitting: *Deleted

## 2022-01-14 DIAGNOSIS — H10029 Other mucopurulent conjunctivitis, unspecified eye: Secondary | ICD-10-CM | POA: Diagnosis not present

## 2022-06-27 ENCOUNTER — Ambulatory Visit: Payer: BC Managed Care – PPO | Admitting: Family

## 2022-06-27 ENCOUNTER — Encounter: Payer: Self-pay | Admitting: Family

## 2022-06-27 ENCOUNTER — Other Ambulatory Visit (HOSPITAL_BASED_OUTPATIENT_CLINIC_OR_DEPARTMENT_OTHER): Payer: Self-pay

## 2022-06-27 VITALS — BP 118/74 | HR 70 | Ht 70.0 in | Wt 216.4 lb

## 2022-06-27 DIAGNOSIS — E66811 Obesity, class 1: Secondary | ICD-10-CM

## 2022-06-27 DIAGNOSIS — E669 Obesity, unspecified: Secondary | ICD-10-CM | POA: Diagnosis not present

## 2022-06-27 DIAGNOSIS — R7309 Other abnormal glucose: Secondary | ICD-10-CM

## 2022-06-27 DIAGNOSIS — Z6831 Body mass index (BMI) 31.0-31.9, adult: Secondary | ICD-10-CM | POA: Diagnosis not present

## 2022-06-27 DIAGNOSIS — R300 Dysuria: Secondary | ICD-10-CM | POA: Diagnosis not present

## 2022-06-27 LAB — POCT URINALYSIS DIPSTICK
Bilirubin, UA: NEGATIVE
Glucose, UA: NEGATIVE
Ketones, UA: NEGATIVE
Nitrite, UA: NEGATIVE
Protein, UA: NEGATIVE
Spec Grav, UA: 1.01 (ref 1.010–1.025)
Urobilinogen, UA: 0.2 E.U./dL
pH, UA: 5 (ref 5.0–8.0)

## 2022-06-27 LAB — CBC WITH DIFFERENTIAL/PLATELET
Basophils Absolute: 0.1 10*3/uL (ref 0.0–0.1)
Basophils Relative: 0.6 % (ref 0.0–3.0)
Eosinophils Absolute: 0.4 10*3/uL (ref 0.0–0.7)
Eosinophils Relative: 3.9 % (ref 0.0–5.0)
HCT: 41.3 % (ref 36.0–46.0)
Hemoglobin: 13.4 g/dL (ref 12.0–15.0)
Lymphocytes Relative: 28.9 % (ref 12.0–46.0)
Lymphs Abs: 2.6 10*3/uL (ref 0.7–4.0)
MCHC: 32.5 g/dL (ref 30.0–36.0)
MCV: 81.5 fl (ref 78.0–100.0)
Monocytes Absolute: 0.4 10*3/uL (ref 0.1–1.0)
Monocytes Relative: 4.9 % (ref 3.0–12.0)
Neutro Abs: 5.6 10*3/uL (ref 1.4–7.7)
Neutrophils Relative %: 61.7 % (ref 43.0–77.0)
Platelets: 244 10*3/uL (ref 150.0–400.0)
RBC: 5.06 Mil/uL (ref 3.87–5.11)
RDW: 13.6 % (ref 11.5–15.5)
WBC: 9.1 10*3/uL (ref 4.0–10.5)

## 2022-06-27 LAB — COMPREHENSIVE METABOLIC PANEL
ALT: 24 U/L (ref 0–35)
AST: 17 U/L (ref 0–37)
Albumin: 4.2 g/dL (ref 3.5–5.2)
Alkaline Phosphatase: 59 U/L (ref 39–117)
BUN: 13 mg/dL (ref 6–23)
CO2: 27 mEq/L (ref 19–32)
Calcium: 9.7 mg/dL (ref 8.4–10.5)
Chloride: 103 mEq/L (ref 96–112)
Creatinine, Ser: 0.81 mg/dL (ref 0.40–1.20)
GFR: 83.62 mL/min (ref >60.00)
Glucose, Bld: 217 mg/dL — ABNORMAL HIGH (ref 70–99)
Potassium: 3.8 mEq/L (ref 3.5–5.1)
Sodium: 138 mEq/L (ref 135–145)
Total Bilirubin: 0.5 mg/dL (ref 0.2–1.2)
Total Protein: 6.7 g/dL (ref 6.0–8.3)

## 2022-06-27 LAB — HEMOGLOBIN A1C: Hgb A1c MFr Bld: 7 % — ABNORMAL HIGH (ref 4.6–6.5)

## 2022-06-27 MED ORDER — NITROFURANTOIN MONOHYD MACRO 100 MG PO CAPS
100.0000 mg | ORAL_CAPSULE | Freq: Two times a day (BID) | ORAL | 0 refills | Status: DC
  Filled 2022-06-27: qty 14, 7d supply, fill #0

## 2022-06-27 MED ORDER — WEGOVY 0.25 MG/0.5ML ~~LOC~~ SOAJ
0.2500 mg | SUBCUTANEOUS | 0 refills | Status: DC
  Filled 2022-06-27: qty 2, 28d supply, fill #0

## 2022-06-27 NOTE — Progress Notes (Addendum)
Sydney Hale is a 52 y.o. female with the following history as recorded in EpicCare:  Patient Active Problem List   Diagnosis Date Noted   Multinodular goiter 01/28/2019   Hemorrhagic ovarian cyst 10/23/2012   Abdominal pain, RLQ 10/22/2012   Allergic rhinitis 11/21/2009   ABDOMINAL PAIN, LEFT LOWER QUADRANT, HX OF 11/21/2009    Current Outpatient Medications  Medication Sig Dispense Refill   aspirin EC 81 MG tablet Take 1 tablet (81 mg total) by mouth daily. Swallow whole. 90 tablet 3   clobetasol ointment (TEMOVATE) 0.05 % Apply topically at bedtime. 60 g 6   Continuous Glucose Sensor (DEXCOM G7 SENSOR) MISC Use as directed to check blood sugar 1 each 3   nitrofurantoin, macrocrystal-monohydrate, (MACROBID) 100 MG capsule Take 1 capsule (100 mg total) by mouth 2 (two) times daily. 14 capsule 0   Semaglutide,0.25 or 0.5MG /DOS, (OZEMPIC, 0.25 OR 0.5 MG/DOSE,) 2 MG/3ML SOPN Inject 0.25 mg into the skin once a week. 3 mL 0   rosuvastatin (CRESTOR) 20 MG tablet Take 1 tablet (20 mg total) by mouth daily. (Patient not taking: Reported on 06/27/2022) 90 tablet 3   No current facility-administered medications for this visit.    Allergies: Erythromycin, Hydrocodone, and Other  Past Medical History:  Diagnosis Date   Abdominal pain, RLQ 10/22/2012   Hemorrhagic ovarian cyst 10/23/2012   PONV (postoperative nausea and vomiting)    S/P endometrial ablation    S/P tubal ligation    Thyroid disease    thyroid nodule   UTI (urinary tract infection)     Past Surgical History:  Procedure Laterality Date   CESAREAN SECTION     x3   COLONOSCOPY     around 2008. Was having rectal bleeding. In [place]   INCISION AND DRAINAGE BREAST ABSCESS  2000   TUBAL LIGATION      Family History  Problem Relation Age of Onset   Hyperlipidemia Mother    Diabetes Mother    CAD Mother 73   Heart attack Mother 20       previous stent re-occluded   Heart disease Mother    Hyperlipidemia Father     Bladder Cancer Father        tobacco abuse   Colon polyps Father    Epilepsy Father    Seizures Father    Hypertension Sister    Other Sister        hyperglycemia   High Cholesterol Sister    Epilepsy Brother    Alcohol abuse Brother        esoph varices   Cancer Maternal Grandmother    Melanoma Maternal Grandmother 5   Colon cancer Maternal Grandmother    Other Maternal Grandfather        no family history known   Cancer Paternal Grandmother        colon?   Alcohol abuse Paternal Grandfather    Other Paternal Grandfather        car accident   Esophageal cancer Neg Hx     Social History   Tobacco Use   Smoking status: Never   Smokeless tobacco: Never  Substance Use Topics   Alcohol use: Yes    Comment: occasionally    Subjective:   Patient is concerned for UTI; symptoms x 5 days with burning, pressure; did see some blood today;  Would also like to discuss weight loss medications- would be open to trying medication if available;    Objective:  Vitals:   06/27/22  1052  BP: 118/74  Pulse: 70  SpO2: 99%  Weight: 216 lb 6.4 oz (98.2 kg)  Height: 5\' 10"  (1.778 m)    General: Well developed, well nourished, in no acute distress  Skin : Warm and dry.  Head: Normocephalic and atraumatic  Lungs: Respirations unlabored; clear to auscultation bilaterally without wheeze, rales, rhonchi  CVS exam: normal rate and regular rhythm.  Neurologic: Alert and oriented; speech intact; face symmetrical; moves all extremities well; CNII-XII intact without focal deficit   Assessment:  1. Obesity (BMI 30.0-34.9)   2. BMI 31.0-31.9,adult   3. Elevated glucose   4. Dysuria     Plan:  Check U/A and urine culture; Rx for Macrobid 100 mg bid x 7 days; follow up to be determined; Check CBC, CMP, hgba1c; trial of Wegovy- risks/ benefits discussed; follow up to be determined based on coverage of medication.   No follow-ups on file.  Orders Placed This Encounter  Procedures   Urine  Culture   CBC with Differential/Platelet   Comp Met (CMET)   Hemoglobin A1c   POCT Urinalysis Dipstick    Requested Prescriptions   Signed Prescriptions Disp Refills   nitrofurantoin, macrocrystal-monohydrate, (MACROBID) 100 MG capsule 14 capsule 0    Sig: Take 1 capsule (100 mg total) by mouth 2 (two) times daily.

## 2022-06-28 ENCOUNTER — Other Ambulatory Visit: Payer: Self-pay | Admitting: Family

## 2022-06-28 ENCOUNTER — Other Ambulatory Visit (HOSPITAL_BASED_OUTPATIENT_CLINIC_OR_DEPARTMENT_OTHER): Payer: Self-pay

## 2022-06-28 ENCOUNTER — Telehealth: Payer: Self-pay

## 2022-06-28 ENCOUNTER — Telehealth: Payer: Self-pay | Admitting: Family

## 2022-06-28 MED ORDER — DEXCOM G7 SENSOR MISC: 3 refills | Status: AC

## 2022-06-28 NOTE — Telephone Encounter (Signed)
PA initiated via Covermymeds; KEY: [id number]. Awaiting determination.

## 2022-06-28 NOTE — Telephone Encounter (Signed)
Spoke with pt, pt is aware of results and express understanding. Please see result notes.

## 2022-06-28 NOTE — Telephone Encounter (Signed)
Pt saw her labs on mychart and has lots of questions. Please call to discuss

## 2022-06-29 LAB — URINE CULTURE
MICRO NUMBER:: 14995529
SPECIMEN QUALITY:: ADEQUATE

## 2022-07-02 ENCOUNTER — Telehealth: Payer: Self-pay

## 2022-07-02 ENCOUNTER — Other Ambulatory Visit (HOSPITAL_BASED_OUTPATIENT_CLINIC_OR_DEPARTMENT_OTHER): Payer: Self-pay

## 2022-07-02 ENCOUNTER — Other Ambulatory Visit: Payer: Self-pay | Admitting: Family

## 2022-07-02 MED ORDER — OZEMPIC (0.25 OR 0.5 MG/DOSE) 2 MG/3ML ~~LOC~~ SOPN: 0.2500 mg | PEN_INJECTOR | SUBCUTANEOUS | 0 refills | Status: DC

## 2022-07-02 NOTE — Telephone Encounter (Signed)
Called pt and left a VM informing pt we are still working to try to get medication approved and a new PA has been started, and for any further questions to give our office a call back.

## 2022-07-02 NOTE — Telephone Encounter (Signed)
PA initiated via Covermymeds; KEY; [id number]. Awaiting determination.

## 2022-07-02 NOTE — Telephone Encounter (Signed)
PA denied. Plan exclusion.  Message from plan: Denied. This health benefit plan does not cover the following services, supplies, drugs or charges: Any treatment or regimen, medical or surgical, for the purpose of reducing or controlling the weight of the member, or for the treatment of obesity, except for surgical treatment of morbid obesity, or as specifically covered by this health benefit plan 

## 2022-07-04 ENCOUNTER — Telehealth: Payer: Self-pay | Admitting: *Deleted

## 2022-07-04 NOTE — Telephone Encounter (Signed)
PA initiated via covermymeds [id number] Awaiting determination.

## 2022-07-04 NOTE — Telephone Encounter (Signed)
PA approved.   Approved. . Authorization Expiration Date: Jul 01, 2023.

## 2022-07-10 ENCOUNTER — Telehealth: Payer: Self-pay | Admitting: Family

## 2022-07-10 NOTE — Telephone Encounter (Signed)
[employer] called regarding some office notes that are needed in order to make a determination of the PA for the Dexcom .

## 2022-07-10 NOTE — Telephone Encounter (Signed)
Office notes and recent lab results have been faxed to [facility].

## 2022-07-12 NOTE — Telephone Encounter (Signed)
Prior auth denied.  The details of why should be faxed to [place] if you want to look out for that.

## 2022-07-12 NOTE — Telephone Encounter (Signed)
Sent pt a Mychart message to inform pt of denial.

## 2022-07-12 NOTE — Telephone Encounter (Signed)
Denial scanned into chart

## 2022-07-23 ENCOUNTER — Other Ambulatory Visit: Payer: Self-pay | Admitting: Family

## 2022-07-23 ENCOUNTER — Encounter: Payer: Self-pay | Admitting: Family

## 2022-07-23 MED ORDER — OZEMPIC (0.25 OR 0.5 MG/DOSE) 2 MG/3ML ~~LOC~~ SOPN: 0.5000 mg | PEN_INJECTOR | SUBCUTANEOUS | 0 refills | Status: DC

## 2022-09-04 ENCOUNTER — Encounter (INDEPENDENT_AMBULATORY_CARE_PROVIDER_SITE_OTHER): Payer: Self-pay

## 2022-09-09 ENCOUNTER — Other Ambulatory Visit: Payer: Self-pay | Admitting: Family

## 2022-09-18 DIAGNOSIS — Z1231 Encounter for screening mammogram for malignant neoplasm of breast: Secondary | ICD-10-CM | POA: Diagnosis not present

## 2022-09-18 LAB — HM MAMMOGRAPHY

## 2022-09-20 ENCOUNTER — Encounter: Payer: Self-pay | Admitting: Family

## 2022-09-27 ENCOUNTER — Encounter: Payer: Self-pay | Admitting: Family

## 2022-09-27 ENCOUNTER — Ambulatory Visit: Payer: BC Managed Care – PPO | Admitting: Family

## 2022-09-27 ENCOUNTER — Other Ambulatory Visit: Payer: Self-pay | Admitting: Family

## 2022-09-27 VITALS — BP 118/80 | HR 65 | Resp 18 | Ht 70.0 in | Wt 206.8 lb

## 2022-09-27 DIAGNOSIS — E119 Type 2 diabetes mellitus without complications: Secondary | ICD-10-CM | POA: Diagnosis not present

## 2022-09-27 DIAGNOSIS — Z7985 Long-term (current) use of injectable non-insulin antidiabetic drugs: Secondary | ICD-10-CM | POA: Diagnosis not present

## 2022-09-27 LAB — CBC WITH DIFFERENTIAL/PLATELET
Basophils Absolute: 0 10*3/uL (ref 0.0–0.1)
Basophils Relative: 0.5 % (ref 0.0–3.0)
Eosinophils Absolute: 0.4 10*3/uL (ref 0.0–0.7)
Eosinophils Relative: 4.4 % (ref 0.0–5.0)
HCT: 42.7 % (ref 36.0–46.0)
Hemoglobin: 14 g/dL (ref 12.0–15.0)
Lymphocytes Relative: 33.6 % (ref 12.0–46.0)
Lymphs Abs: 3.1 10*3/uL (ref 0.7–4.0)
MCHC: 32.8 g/dL (ref 30.0–36.0)
MCV: 80.8 fl (ref 78.0–100.0)
Monocytes Absolute: 0.5 10*3/uL (ref 0.1–1.0)
Monocytes Relative: 5.1 % (ref 3.0–12.0)
Neutro Abs: 5.3 10*3/uL (ref 1.4–7.7)
Neutrophils Relative %: 56.4 % (ref 43.0–77.0)
Platelets: 283 10*3/uL (ref 150.0–400.0)
RBC: 5.29 Mil/uL — ABNORMAL HIGH (ref 3.87–5.11)
RDW: 13.5 % (ref 11.5–15.5)
WBC: 9.3 10*3/uL (ref 4.0–10.5)

## 2022-09-27 LAB — LIPID PANEL
Cholesterol: 251 mg/dL — ABNORMAL HIGH (ref 0–200)
HDL: 37.6 mg/dL — ABNORMAL LOW (ref >39.00)
NonHDL: 213.66
Total CHOL/HDL Ratio: 7
Triglycerides: 248 mg/dL — ABNORMAL HIGH (ref 0.0–149.0)
VLDL: 49.6 mg/dL — ABNORMAL HIGH (ref 0.0–40.0)

## 2022-09-27 LAB — COMPREHENSIVE METABOLIC PANEL
ALT: 23 U/L (ref 0–35)
AST: 15 U/L (ref 0–37)
Albumin: 4.4 g/dL (ref 3.5–5.2)
Alkaline Phosphatase: 65 U/L (ref 39–117)
BUN: 14 mg/dL (ref 6–23)
CO2: 26 mEq/L (ref 19–32)
Calcium: 10.1 mg/dL (ref 8.4–10.5)
Chloride: 105 mEq/L (ref 96–112)
Creatinine, Ser: 0.88 mg/dL (ref 0.40–1.20)
GFR: 75.57 mL/min (ref >60.00)
Glucose, Bld: 122 mg/dL — ABNORMAL HIGH (ref 70–99)
Potassium: 4 mEq/L (ref 3.5–5.1)
Sodium: 140 mEq/L (ref 135–145)
Total Bilirubin: 0.5 mg/dL (ref 0.2–1.2)
Total Protein: 7 g/dL (ref 6.0–8.3)

## 2022-09-27 LAB — HEMOGLOBIN A1C: Hgb A1c MFr Bld: 6.1 % (ref 4.6–6.5)

## 2022-09-27 LAB — LDL CHOLESTEROL, DIRECT: Direct LDL: 191 mg/dL

## 2022-09-27 MED ORDER — SEMAGLUTIDE (1 MG/DOSE) 4 MG/3ML ~~LOC~~ SOPN: 1.0000 mg | PEN_INJECTOR | SUBCUTANEOUS | 3 refills | Status: DC

## 2022-09-27 MED ORDER — ROSUVASTATIN CALCIUM 10 MG PO TABS: 10.0000 mg | ORAL_TABLET | Freq: Every day | ORAL | 3 refills | Status: DC

## 2022-09-27 NOTE — Progress Notes (Signed)
**Note De-Identified Sydney Obfuscation** Sydney Hale is a 52 y.o. female with the following history as recorded in EpicCare:  Patient Active Problem List   Diagnosis Date Noted   Multinodular goiter 01/28/2019   Hemorrhagic ovarian cyst 10/23/2012   Abdominal pain, RLQ 10/22/2012   Allergic rhinitis 11/21/2009   ABDOMINAL PAIN, LEFT LOWER QUADRANT, HX OF 11/21/2009    Current Outpatient Medications  Medication Sig Dispense Refill   aspirin EC 81 MG tablet Take 1 tablet (81 mg total) by mouth daily. Swallow whole. 90 tablet 3   clobetasol ointment (TEMOVATE) 0.05 % Apply topically at bedtime. 60 g 6   Continuous Glucose Sensor (DEXCOM G7 SENSOR) MISC Use as directed to check blood sugar 1 each 3   rosuvastatin (CRESTOR) 10 MG tablet Take 1 tablet (10 mg total) by mouth daily. 90 tablet 3   Semaglutide, 1 MG/DOSE, 4 MG/3ML SOPN Inject 1 mg as directed once a week. 3 mL 3   No current facility-administered medications for this visit.    Allergies: Erythromycin, Hydrocodone, and Other  Past Medical History:  Diagnosis Date   Abdominal pain, RLQ 10/22/2012   Hemorrhagic ovarian cyst 10/23/2012   PONV (postoperative nausea and vomiting)    S/P endometrial ablation    S/P tubal ligation    Thyroid disease    thyroid nodule   UTI (urinary tract infection)     Past Surgical History:  Procedure Laterality Date   CESAREAN SECTION     x3   COLONOSCOPY     around 2008. Was having rectal bleeding. In [place]   INCISION AND DRAINAGE BREAST ABSCESS  2000   TUBAL LIGATION      Family History  Problem Relation Age of Onset   Hyperlipidemia Mother    Diabetes Mother    CAD Mother 39   Heart attack Mother 1       previous stent re-occluded   Heart disease Mother    Hyperlipidemia Father    Bladder Cancer Father        tobacco abuse   Colon polyps Father    Epilepsy Father    Seizures Father    Hypertension Sister    Other Sister        hyperglycemia   High Cholesterol Sister    Epilepsy Brother    Alcohol abuse  Brother        esoph varices   Cancer Maternal Grandmother    Melanoma Maternal Grandmother 2   Colon cancer Maternal Grandmother    Other Maternal Grandfather        no family history known   Cancer Paternal Grandmother        colon?   Alcohol abuse Paternal Grandfather    Other Paternal Grandfather        car accident   Esophageal cancer Neg Hx     Social History   Tobacco Use   Smoking status: Never   Smokeless tobacco: Never  Substance Use Topics   Alcohol use: Yes    Comment: occasionally    Subjective:   Follow up on Type 2 Diabetes; readily admits that in shock and upset about her diagnosis; is now ready to meet with Diabetic educator; tolerating Ozempic well- would be okay to increase dosage;    Objective:  Vitals:   09/27/22 0858  BP: 118/80  Pulse: 65  Resp: 18  SpO2: 100%  Weight: 206 lb 12.8 oz (93.8 kg)  Height: 5\' 10"  (1.778 m)    General: Well developed, well nourished, in  no acute distress  Skin : Warm and dry.  Head: Normocephalic and atraumatic  Lungs: Respirations unlabored; clear to auscultation bilaterally without wheeze, rales, rhonchi  CVS exam: normal rate and regular rhythm.  Neurologic: Alert and oriented; speech intact; face symmetrical; moves all extremities well; CNII-XII intact without focal deficit   Assessment:  1. Type 2 diabetes mellitus without complication, without long-term current use of insulin (HCC)     Plan:  Update labs today; increase Ozempic to 1 mg weekly; refer to diabetic educator; follow up in 4 months, sooner prn.   Return in about 4 months (around 01/27/2023).  Orders Placed This Encounter  Procedures   CBC with Differential/Platelet   Comp Met (CMET)   Hemoglobin A1c   Lipid panel   Amb Referral to Nutrition and Diabetic Education    Referral Priority:   Routine    Referral Type:   Consultation    Referral Reason:   Specialty Services Required    Number of Visits Requested:   1    Requested  Prescriptions   Signed Prescriptions Disp Refills   Semaglutide, 1 MG/DOSE, 4 MG/3ML SOPN 3 mL 3    Sig: Inject 1 mg as directed once a week.   rosuvastatin (CRESTOR) 10 MG tablet 90 tablet 3    Sig: Take 1 tablet (10 mg total) by mouth daily.

## 2022-09-27 NOTE — Patient Instructions (Signed)
Call next month if you decide you want to try the Cymbalta ( Duloxetine);

## 2023-01-16 DIAGNOSIS — E119 Type 2 diabetes mellitus without complications: Secondary | ICD-10-CM | POA: Diagnosis not present

## 2023-01-16 DIAGNOSIS — Z7984 Long term (current) use of oral hypoglycemic drugs: Secondary | ICD-10-CM | POA: Diagnosis not present

## 2023-01-16 DIAGNOSIS — H43393 Other vitreous opacities, bilateral: Secondary | ICD-10-CM | POA: Diagnosis not present

## 2023-01-16 DIAGNOSIS — H04123 Dry eye syndrome of bilateral lacrimal glands: Secondary | ICD-10-CM | POA: Diagnosis not present

## 2023-02-02 ENCOUNTER — Other Ambulatory Visit: Payer: Self-pay | Admitting: Family

## 2023-02-06 ENCOUNTER — Ambulatory Visit: Payer: BC Managed Care – PPO | Admitting: Family

## 2023-02-09 DIAGNOSIS — H10029 Other mucopurulent conjunctivitis, unspecified eye: Secondary | ICD-10-CM | POA: Diagnosis not present

## 2023-03-10 ENCOUNTER — Other Ambulatory Visit: Payer: Self-pay | Admitting: Family

## 2023-04-05 ENCOUNTER — Other Ambulatory Visit: Payer: Self-pay | Admitting: Family

## 2023-05-13 ENCOUNTER — Ambulatory Visit: Admitting: Family

## 2023-05-13 ENCOUNTER — Encounter: Payer: Self-pay | Admitting: Family

## 2023-05-13 VITALS — BP 118/70 | HR 64 | Ht 70.0 in | Wt 202.6 lb

## 2023-05-13 DIAGNOSIS — E119 Type 2 diabetes mellitus without complications: Secondary | ICD-10-CM

## 2023-05-13 DIAGNOSIS — Z7985 Long-term (current) use of injectable non-insulin antidiabetic drugs: Secondary | ICD-10-CM

## 2023-05-13 DIAGNOSIS — L900 Lichen sclerosus et atrophicus: Secondary | ICD-10-CM

## 2023-05-13 DIAGNOSIS — Z13220 Encounter for screening for lipoid disorders: Secondary | ICD-10-CM

## 2023-05-13 DIAGNOSIS — R110 Nausea: Secondary | ICD-10-CM | POA: Diagnosis not present

## 2023-05-13 DIAGNOSIS — R5383 Other fatigue: Secondary | ICD-10-CM

## 2023-05-13 MED ORDER — CLOBETASOL PROPIONATE 0.05 % EX OINT: TOPICAL_OINTMENT | Freq: Every day | CUTANEOUS | 6 refills | Status: AC

## 2023-05-13 NOTE — Progress Notes (Signed)
 Sydney Hale is a 53 y.o. female with the following history as recorded in EpicCare:  Patient Active Problem List   Diagnosis Date Noted   Multinodular goiter 01/28/2019   Hemorrhagic ovarian cyst 10/23/2012   Abdominal pain, RLQ 10/22/2012   Allergic rhinitis 11/21/2009   ABDOMINAL PAIN, LEFT LOWER QUADRANT, HX OF 11/21/2009    Current Outpatient Medications  Medication Sig Dispense Refill   aspirin EC 81 MG tablet Take 1 tablet (81 mg total) by mouth daily. Swallow whole. 90 tablet 3   Continuous Glucose Sensor (DEXCOM G7 SENSOR) MISC Use as directed to check blood sugar 1 each 3   rosuvastatin (CRESTOR) 10 MG tablet Take 1 tablet (10 mg total) by mouth daily. 90 tablet 3   Semaglutide, 1 MG/DOSE, (OZEMPIC, 1 MG/DOSE,) 4 MG/3ML SOPN Inject 1 mg into the skin once a week. 3 mL 0   clobetasol ointment (TEMOVATE) 0.05 % Apply topically at bedtime. 60 g 6   No current facility-administered medications for this visit.    Allergies: Erythromycin, Hydrocodone, and Other  Past Medical History:  Diagnosis Date   Abdominal pain, RLQ 10/22/2012   Hemorrhagic ovarian cyst 10/23/2012   PONV (postoperative nausea and vomiting)    S/P endometrial ablation    S/P tubal ligation    Thyroid disease    thyroid nodule   UTI (urinary tract infection)     Past Surgical History:  Procedure Laterality Date   CESAREAN SECTION     x3   COLONOSCOPY     around 2008. Was having rectal bleeding. In [place]   INCISION AND DRAINAGE BREAST ABSCESS  2000   TUBAL LIGATION      Family History  Problem Relation Age of Onset   Hyperlipidemia Mother    Diabetes Mother    CAD Mother 39   Heart attack Mother 31       previous stent re-occluded   Heart disease Mother    Hyperlipidemia Father    Bladder Cancer Father        tobacco abuse   Colon polyps Father    Epilepsy Father    Seizures Father    Hypertension Sister    Other Sister        hyperglycemia   High Cholesterol Sister    Epilepsy  Brother    Alcohol abuse Brother        esoph varices   Cancer Maternal Grandmother    Melanoma Maternal Grandmother 70   Colon cancer Maternal Grandmother    Other Maternal Grandfather        no family history known   Cancer Paternal Grandmother        colon?   Alcohol abuse Paternal Grandfather    Other Paternal Grandfather        car accident   Esophageal cancer Neg Hx     Social History   Tobacco Use   Smoking status: Never   Smokeless tobacco: Never  Substance Use Topics   Alcohol use: Yes    Comment: occasionally    Subjective:   8 month follow up on Type 2 Diabetes; concerned about some nausea with Ozempic- has done well with weight loss- down 20 pounds;   Objective:  Vitals:   05/13/23 1527  BP: 118/70  Pulse: 64  SpO2: 99%  Weight: 202 lb 9.6 oz (91.9 kg)  Height: 5\' 10"  (1.778 m)    General: Well developed, well nourished, in no acute distress  Skin : Warm and dry.  Head: Normocephalic and atraumatic  Eyes: Sclera and conjunctiva clear; pupils round and reactive to light; extraocular movements intact  Ears: External normal; canals clear; tympanic membranes normal  Oropharynx: Pink, supple. No suspicious lesions  Neck: Supple without thyromegaly, adenopathy  Lungs: Respirations unlabored; clear to auscultation bilaterally without wheeze, rales, rhonchi  CVS exam: normal rate and regular rhythm.  Neurologic: Alert and oriented; speech intact; face symmetrical; moves all extremities well; CNII-XII intact without focal deficit   Assessment:  1. Type 2 diabetes mellitus without complication, without long-term current use of insulin (HCC)   2. Lipid screening   3. Nausea   4. Other fatigue   5. Lichen sclerosus     Plan:  Update labs today- to consider lowering dosage of Ozempic; will update TSH today as well; discussed possible seasonal affective disorder;  Stable- refill updated on Clobetasol;   No follow-ups on file.  Orders Placed This Encounter   Procedures   CBC with Differential/Platelet   Comp Met (CMET)   Lipid panel   Hemoglobin A1c   Amylase   Lipase   TSH    Requested Prescriptions   Signed Prescriptions Disp Refills   clobetasol ointment (TEMOVATE) 0.05 % 60 g 6    Sig: Apply topically at bedtime.

## 2023-05-14 LAB — HEMOGLOBIN A1C: Hgb A1c MFr Bld: 5.9 % (ref 4.6–6.5)

## 2023-05-14 LAB — CBC WITH DIFFERENTIAL/PLATELET
Basophils Absolute: 0.1 10*3/uL (ref 0.0–0.1)
Basophils Relative: 1.1 % (ref 0.0–3.0)
Eosinophils Absolute: 0.2 10*3/uL (ref 0.0–0.7)
Eosinophils Relative: 2.7 % (ref 0.0–5.0)
HCT: 43.3 % (ref 36.0–46.0)
Hemoglobin: 14.1 g/dL (ref 12.0–15.0)
Lymphocytes Relative: 35.6 % (ref 12.0–46.0)
Lymphs Abs: 2.9 10*3/uL (ref 0.7–4.0)
MCHC: 32.7 g/dL (ref 30.0–36.0)
MCV: 82.7 fl (ref 78.0–100.0)
Monocytes Absolute: 0.5 10*3/uL (ref 0.1–1.0)
Monocytes Relative: 5.6 % (ref 3.0–12.0)
Neutro Abs: 4.5 10*3/uL (ref 1.4–7.7)
Neutrophils Relative %: 55 % (ref 43.0–77.0)
Platelets: 269 10*3/uL (ref 150.0–400.0)
RBC: 5.23 Mil/uL — ABNORMAL HIGH (ref 3.87–5.11)
RDW: 14.3 % (ref 11.5–15.5)
WBC: 8.3 10*3/uL (ref 4.0–10.5)

## 2023-05-14 LAB — COMPREHENSIVE METABOLIC PANEL WITH GFR
ALT: 22 U/L (ref 0–35)
AST: 18 U/L (ref 0–37)
Albumin: 4.7 g/dL (ref 3.5–5.2)
Alkaline Phosphatase: 55 U/L (ref 39–117)
BUN: 17 mg/dL (ref 6–23)
CO2: 27 meq/L (ref 19–32)
Calcium: 9.9 mg/dL (ref 8.4–10.5)
Chloride: 106 meq/L (ref 96–112)
Creatinine, Ser: 0.78 mg/dL (ref 0.40–1.20)
GFR: 86.95 mL/min (ref >60.00)
Glucose, Bld: 98 mg/dL (ref 70–99)
Potassium: 4 meq/L (ref 3.5–5.1)
Sodium: 142 meq/L (ref 135–145)
Total Bilirubin: 0.5 mg/dL (ref 0.2–1.2)
Total Protein: 7 g/dL (ref 6.0–8.3)

## 2023-05-14 LAB — LIPID PANEL
Cholesterol: 197 mg/dL (ref 0–200)
HDL: 37.1 mg/dL — ABNORMAL LOW (ref >39.00)
LDL Cholesterol: 104 mg/dL — ABNORMAL HIGH (ref 0–99)
NonHDL: 159.77
Total CHOL/HDL Ratio: 5
Triglycerides: 277 mg/dL — ABNORMAL HIGH (ref 0.0–149.0)
VLDL: 55.4 mg/dL — ABNORMAL HIGH (ref 0.0–40.0)

## 2023-05-14 LAB — TSH: TSH: 1.25 u[IU]/mL (ref 0.35–5.50)

## 2023-05-14 LAB — LIPASE: Lipase: 47 U/L (ref 11.0–59.0)

## 2023-05-14 LAB — AMYLASE: Amylase: 33 U/L (ref 27–131)

## 2023-05-15 ENCOUNTER — Other Ambulatory Visit: Payer: Self-pay | Admitting: Family

## 2023-05-15 ENCOUNTER — Encounter: Payer: Self-pay | Admitting: Family

## 2023-05-15 MED ORDER — OZEMPIC (0.25 OR 0.5 MG/DOSE) 2 MG/3ML ~~LOC~~ SOPN: 0.5000 mg | PEN_INJECTOR | SUBCUTANEOUS | 2 refills | Status: AC

## 2023-06-19 ENCOUNTER — Other Ambulatory Visit (HOSPITAL_COMMUNITY): Payer: Self-pay

## 2023-06-19 ENCOUNTER — Telehealth: Payer: Self-pay

## 2023-06-19 NOTE — Telephone Encounter (Signed)
 Pharmacy Patient Advocate Encounter   Received notification from Patient Pharmacy that prior authorization for ozempic2 is required/requested.   Insurance verification completed.   The patient is insured through [facility] .   Per test claim: The current 28 day co-pay is, $24.99.  No PA needed at this time. This test claim was processed through [facility]- copay amounts may vary at other pharmacies due to pharmacy/plan contracts, or as the patient moves through the different stages of their insurance plan.

## 2023-06-20 ENCOUNTER — Other Ambulatory Visit: Payer: Self-pay | Admitting: Family

## 2023-06-20 ENCOUNTER — Other Ambulatory Visit (INDEPENDENT_AMBULATORY_CARE_PROVIDER_SITE_OTHER)

## 2023-06-20 ENCOUNTER — Telehealth: Admitting: Family

## 2023-06-20 ENCOUNTER — Encounter: Payer: Self-pay | Admitting: Family

## 2023-06-20 ENCOUNTER — Ambulatory Visit (HOSPITAL_BASED_OUTPATIENT_CLINIC_OR_DEPARTMENT_OTHER)
Admission: RE | Admit: 2023-06-20 | Discharge: 2023-06-20 | Disposition: A | Discharge status: Home / Self Care | Source: Ambulatory Visit | Attending: Family | Admitting: Family

## 2023-06-20 VITALS — Ht 70.0 in

## 2023-06-20 DIAGNOSIS — R0781 Pleurodynia: Secondary | ICD-10-CM | POA: Diagnosis not present

## 2023-06-20 DIAGNOSIS — R1012 Left upper quadrant pain: Secondary | ICD-10-CM | POA: Diagnosis not present

## 2023-06-20 LAB — CBC WITH DIFFERENTIAL/PLATELET
Basophils Absolute: 0 10*3/uL (ref 0.0–0.1)
Basophils Relative: 0.3 % (ref 0.0–3.0)
Eosinophils Absolute: 0.3 10*3/uL (ref 0.0–0.7)
Eosinophils Relative: 3.5 % (ref 0.0–5.0)
HCT: 41.6 % (ref 36.0–46.0)
Hemoglobin: 13.9 g/dL (ref 12.0–15.0)
Lymphocytes Relative: 33.6 % (ref 12.0–46.0)
Lymphs Abs: 2.8 10*3/uL (ref 0.7–4.0)
MCHC: 33.4 g/dL (ref 30.0–36.0)
MCV: 80.6 fl (ref 78.0–100.0)
Monocytes Absolute: 0.6 10*3/uL (ref 0.1–1.0)
Monocytes Relative: 6.7 % (ref 3.0–12.0)
Neutro Abs: 4.7 10*3/uL (ref 1.4–7.7)
Neutrophils Relative %: 55.9 % (ref 43.0–77.0)
Platelets: 267 10*3/uL (ref 150.0–400.0)
RBC: 5.16 Mil/uL — ABNORMAL HIGH (ref 3.87–5.11)
RDW: 13.4 % (ref 11.5–15.5)
WBC: 8.3 10*3/uL (ref 4.0–10.5)

## 2023-06-20 LAB — COMPREHENSIVE METABOLIC PANEL WITH GFR
ALT: 24 U/L (ref 0–35)
AST: 18 U/L (ref 0–37)
Albumin: 4.5 g/dL (ref 3.5–5.2)
Alkaline Phosphatase: 73 U/L (ref 39–117)
BUN: 19 mg/dL (ref 6–23)
CO2: 29 meq/L (ref 19–32)
Calcium: 9.5 mg/dL (ref 8.4–10.5)
Chloride: 105 meq/L (ref 96–112)
Creatinine, Ser: 0.77 mg/dL (ref 0.40–1.20)
GFR: 88.24 mL/min (ref >60.00)
Glucose, Bld: 84 mg/dL (ref 70–99)
Potassium: 3.9 meq/L (ref 3.5–5.1)
Sodium: 141 meq/L (ref 135–145)
Total Bilirubin: 0.6 mg/dL (ref 0.2–1.2)
Total Protein: 7.1 g/dL (ref 6.0–8.3)

## 2023-06-20 LAB — LIPASE: Lipase: 44 U/L (ref 11.0–59.0)

## 2023-06-20 LAB — AMYLASE: Amylase: 42 U/L (ref 27–131)

## 2023-06-20 NOTE — Progress Notes (Signed)
 k

## 2023-06-20 NOTE — Telephone Encounter (Addendum)
 Pharmacy Patient Advocate Encounter   Received notification from Pt Calls Messages that prior authorization for Ozempic  2 is required/requested.   Insurance verification completed.   The patient is insured through [facility] .   Per test claim: PA required; PA submitted to above mentioned insurance via CoverMyMeds Key/confirmation #/EOC [id number] Status is pending

## 2023-06-20 NOTE — Progress Notes (Signed)
 Sydney Hale is a 53 y.o. female with the following history as recorded in EpicCare:  Patient Active Problem List   Diagnosis Date Noted   Multinodular goiter 01/28/2019   Hemorrhagic ovarian cyst 10/23/2012   Abdominal pain, RLQ 10/22/2012   Allergic rhinitis 11/21/2009   ABDOMINAL PAIN, LEFT LOWER QUADRANT, HX OF 11/21/2009    Current Outpatient Medications  Medication Sig Dispense Refill   aspirin  EC 81 MG tablet Take 1 tablet (81 mg total) by mouth daily. Swallow whole. 90 tablet 3   clobetasol  ointment (TEMOVATE ) 0.05 % Apply topically at bedtime. 60 g 6   Continuous Glucose Sensor (DEXCOM G7 SENSOR) MISC Use as directed to check blood sugar 1 each 3   rosuvastatin  (CRESTOR ) 10 MG tablet Take 1 tablet (10 mg total) by mouth daily. 90 tablet 3   Semaglutide ,0.25 or 0.5MG /DOS, (OZEMPIC , 0.25 OR 0.5 MG/DOSE,) 2 MG/3ML SOPN Inject 0.5 mg into the skin once a week. 3 mL 2   No current facility-administered medications for this visit.    Allergies: Erythromycin, Hydrocodone , and Other  Past Medical History:  Diagnosis Date   Abdominal pain, RLQ 10/22/2012   Hemorrhagic ovarian cyst 10/23/2012   PONV (postoperative nausea and vomiting)    S/P endometrial ablation    S/P tubal ligation    Thyroid  disease    thyroid  nodule   UTI (urinary tract infection)     Past Surgical History:  Procedure Laterality Date   CESAREAN SECTION     x3   COLONOSCOPY     around 2008. Was having rectal bleeding. In [place]    INCISION AND DRAINAGE BREAST ABSCESS  2000   TUBAL LIGATION      Family History  Problem Relation Age of Onset   Hyperlipidemia Mother    Diabetes Mother    CAD Mother 80   Heart attack Mother 55       previous stent re-occluded   Heart disease Mother    Hyperlipidemia Father    Bladder Cancer Father        tobacco abuse   Colon polyps Father    Epilepsy Father    Seizures Father    Hypertension Sister    Other Sister        hyperglycemia   High Cholesterol  Sister    Epilepsy Brother    Alcohol abuse Brother        esoph varices   Cancer Maternal Grandmother    Melanoma Maternal Grandmother 77   Colon cancer Maternal Grandmother    Other Maternal Grandfather        no family history known   Cancer Paternal Grandmother        colon?   Alcohol abuse Paternal Grandfather    Other Paternal Grandfather        car accident   Esophageal cancer Neg Hx     Social History   Tobacco Use   Smoking status: Never   Smokeless tobacco: Never  Substance Use Topics   Alcohol use: Yes    Comment: occasionally    Subjective:    I connected with Ira Mann on 06/20/23 at  9:40 AM EDT by a video enabled telemedicine application and verified that I am speaking with the correct person using two identifiers.   I discussed the limitations of evaluation and management by telemedicine and the availability of in person appointments. The patient expressed understanding and agreed to proceed. Provider in office/ patient is at home; provider and patient are only  2 people on video call.   Left sided rib pain "on and off" for the past 4-6 weeks; no specific injury but does note that pain is progressively getting worsening; notes she is not taking Ozempic  and has not taken regularly; has taken full bottle of Ibuprofen  800 mg with limited relief due to secondary nausea;   Objective:  Vitals:   06/20/23 1000  Height: 5\' 10"  (1.778 m)    General: Well developed, well nourished, in no acute distress  Skin : Warm and dry.  Head: Normocephalic and atraumatic  Lungs: Respirations unlabored;  Neurologic: Alert and oriented; speech intact; face symmetrical;   Assessment:  1. LUQ pain     Plan:  Patient will come today to get her labs updated; will update CXR and bilateral rib X-rays; she has not been taking her Ozempic  but discussed concern for complications from that medication; if imaging is normal, to consider Mobic and Robaxin ; may also need to consider  imaging of abdomen; follow up to be determined;   No follow-ups on file.  Orders Placed This Encounter  Procedures   CBC with Differential/Platelet    Standing Status:   Future    Expiration Date:   06/19/2024   Comp Met (CMET)    Standing Status:   Future    Expiration Date:   06/19/2024   Amylase    Standing Status:   Future    Expiration Date:   06/19/2024   Lipase    Standing Status:   Future    Expiration Date:   06/19/2024    Requested Prescriptions    No prescriptions requested or ordered in this encounter

## 2023-06-23 ENCOUNTER — Ambulatory Visit: Payer: Self-pay | Admitting: Family

## 2023-07-01 ENCOUNTER — Other Ambulatory Visit (HOSPITAL_COMMUNITY): Payer: Self-pay

## 2023-07-02 ENCOUNTER — Other Ambulatory Visit (HOSPITAL_COMMUNITY): Payer: Self-pay

## 2023-07-03 ENCOUNTER — Telehealth: Payer: Self-pay

## 2023-07-03 ENCOUNTER — Other Ambulatory Visit (HOSPITAL_COMMUNITY): Payer: Self-pay

## 2023-07-03 NOTE — Telephone Encounter (Signed)
 Pharmacy Patient Advocate Encounter   Received notification from Pt Calls Messages that prior authorization for Ozempic  is required/requested.   Insurance verification completed.   The patient is insured through [facility] .   Per test claim: The current 28 day co-pay is, $24.99.  No PA needed at this time. This test claim was processed through [facility]- copay amounts may vary at other pharmacies due to pharmacy/plan contracts, or as the patient moves through the different stages of their insurance plan.

## 2023-07-24 ENCOUNTER — Encounter: Payer: Self-pay | Admitting: Family

## 2023-09-23 DIAGNOSIS — Z1231 Encounter for screening mammogram for malignant neoplasm of breast: Secondary | ICD-10-CM | POA: Diagnosis not present

## 2023-09-23 LAB — HM MAMMOGRAPHY

## 2023-09-26 ENCOUNTER — Encounter: Payer: Self-pay | Admitting: Family

## 2023-09-30 ENCOUNTER — Other Ambulatory Visit: Payer: Self-pay | Admitting: Family

## 2023-12-15 ENCOUNTER — Ambulatory Visit: Admitting: Family Medicine

## 2023-12-22 ENCOUNTER — Ambulatory Visit: Admitting: Family Medicine

## 2023-12-22 ENCOUNTER — Encounter: Payer: Self-pay | Admitting: Family Medicine

## 2023-12-22 VITALS — BP 130/70 | HR 110 | Temp 98.4°F | Resp 18 | Ht 70.0 in | Wt 210.6 lb

## 2023-12-22 DIAGNOSIS — E119 Type 2 diabetes mellitus without complications: Secondary | ICD-10-CM | POA: Diagnosis not present

## 2023-12-22 NOTE — Progress Notes (Signed)
 Subjective:    Patient ID: Sydney Hale, female    DOB: Oct 27, 1970, 53 y.o.   MRN: [id number]  Chief Complaint  Patient presents with   New Patient (Initial Visit)    TOC from [facility], would like to discuss glucoses and a1c    HPI Patient is in today for glucose and A1c.  Discussed the use of AI scribe software for clinical note transcription with the patient, who gave verbal consent to proceed.  History of Present Illness Sydney Hale is a 53 year old female with diabetes who presents with concerns about elevated blood sugar levels after discontinuing Ozempic . She is accompanied by her daughter, Sydney Hale.  She has experienced elevated blood sugar levels since discontinuing Ozempic  around May or June 2025, with levels consistently over 200 mg/dL and spot checks between 200 and 300 mg/dL. Morning blood sugars start at around 140 mg/dL. She attributes previous control to Ozempic , which she stopped due to nausea and left rib pain. She has not been monitoring her blood sugar closely since stopping the medication.  She has a family history of diabetes; her mother is a diabetic with a history of multiple heart attacks. She is concerned about her own risk of heart attacks, especially when experiencing chest twinges or sweating. She feels more tired and believes her blood sugars are more reactive to food than before.  She has been using a Dexcom intermittently to monitor her blood sugar levels, but insurance does not cover it, making it expensive. She dislikes fingerstick testing due to her work as a engineer, civil (consulting), which involves frequent hand use. She has not been using a Dexcom recently due to cost concerns.  Her last A1c test was approximately seven months ago, in April 2025, and was within a good range, although a year ago it was in the diabetic range at 7%.  She experienced a night of chest pain she thought might be epigastric, which resolved after taking Tums and baby aspirin .  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 She has a daughter, Sydney Hale, who had a twin that died at age 42.    Past Medical History:  Diagnosis Date   Abdominal pain, RLQ 10/22/2012   Hemorrhagic ovarian cyst 10/23/2012   PONV (postoperative nausea and vomiting)    S/P endometrial ablation    S/P tubal ligation    Thyroid  disease    thyroid  nodule   UTI (urinary tract infection)     Past Surgical History:  Procedure Laterality Date   CESAREAN SECTION     x3   COLONOSCOPY     around 2008. Was having rectal bleeding. In [place]    INCISION AND DRAINAGE BREAST ABSCESS  2000   TUBAL LIGATION      Family History  Problem Relation Age of Onset   Hyperlipidemia Mother    Diabetes Mother    CAD Mother 45   Heart attack Mother 68       previous stent re-occluded   Heart disease Mother    Hyperlipidemia Father    Bladder Cancer Father        tobacco abuse   Colon polyps Father    Epilepsy Father    Seizures Father    Hypertension Sister    Other Sister        hyperglycemia   High Cholesterol Sister    Epilepsy Brother    Alcohol abuse Brother        esoph varices  Cancer Maternal Grandmother    Melanoma Maternal Grandmother 35   Colon cancer Maternal Grandmother    Other Maternal Grandfather        no family history known   Cancer Paternal Grandmother        colon?   Alcohol abuse Paternal Grandfather    Other Paternal Grandfather        car accident   Esophageal cancer Neg Hx     Social History   Socioeconomic History   Marital status: Married    Spouse name: Not on file   Number of children: 4   Years of education: Not on file   Highest education level: Not on file  Occupational History   Occupation: Nurse    Comment: Hospice nurse   Tobacco Use   Smoking status: Never   Smokeless tobacco: Never  Vaping Use   Vaping status: Never Used  Substance and Sexual Activity   Alcohol use: Yes    Comment: occasionally   Drug use: No    Sexual activity: Yes    Birth control/protection: None  Other Topics Concern   Not on file  Social History Narrative   Not on file   Social Drivers of Health   Financial Resource Strain: Not on file  Food Insecurity: Not on file  Transportation Needs: Not on file  Physical Activity: Not on file  Stress: Not on file  Social Connections: Unknown (06/19/2021)   Received [facility]   Social Network    Social Network: Not on file  Intimate Partner Violence: Unknown (05/11/2021)   Received from [facility]   HITS    Physically Hurt: Not on file    Insult or Talk Down To: Not on file    Threaten Physical Harm: Not on file    Scream or Curse: Not on file    Outpatient Medications Prior to Visit  Medication Sig Dispense Refill   aspirin  EC 81 MG tablet Take 1 tablet (81 mg total) by mouth daily. Swallow whole. 90 tablet 3   clobetasol  ointment (TEMOVATE ) 0.05 % Apply topically at bedtime. 60 g 6   Continuous Glucose Sensor (DEXCOM G7 SENSOR) MISC Use as directed to check blood sugar 1 each 3   rosuvastatin  (CRESTOR ) 10 MG tablet TAKE 1 TABLET BY MOUTH EVERY DAY 90 tablet 1   Semaglutide ,0.25 or 0.5MG /DOS, (OZEMPIC , 0.25 OR 0.5 MG/DOSE,) 2 MG/3ML SOPN Inject 0.5 mg into the skin once a week. (Patient not taking: Reported on 12/22/2023) 3 mL 2   No facility-administered medications prior to visit.    Allergies  Allergen Reactions   Erythromycin Other (See Comments)    Unknown, was told by mother   Hydrocodone      Nausea and vomiting   Other Rash    Nickel metals.     Review of Systems  Constitutional:  Negative for chills, fever and malaise/fatigue.  HENT:  Negative for congestion and hearing loss.   Eyes:  Negative for blurred vision and discharge.  Respiratory:  Negative for cough, sputum production and shortness of breath.   Cardiovascular:  Negative for chest pain, palpitations and leg swelling.  Gastrointestinal:  Negative for abdominal pain, blood in stool,  constipation, diarrhea, heartburn, nausea and vomiting.  Genitourinary:  Negative for dysuria, frequency, hematuria and urgency.  Musculoskeletal:  Negative for back pain, falls and myalgias.  Skin:  Negative for rash.  Neurological:  Negative for dizziness, sensory change, loss of consciousness, weakness and headaches.  Endo/Heme/Allergies:  Negative for  environmental allergies. Does not bruise/bleed easily.  Psychiatric/Behavioral:  Negative for depression and suicidal ideas. The patient is not nervous/anxious and does not have insomnia.        Objective:    Physical Exam Vitals and nursing note reviewed.  Constitutional:      General: She is not in acute distress.    Appearance: Normal appearance. She is well-developed.  HENT:     Head: Normocephalic and atraumatic.  Eyes:     General: No scleral icterus.       Right eye: No discharge.        Left eye: No discharge.  Cardiovascular:     Rate and Rhythm: Normal rate and regular rhythm.     Heart sounds: No murmur heard. Pulmonary:     Effort: Pulmonary effort is normal. No respiratory distress.     Breath sounds: Normal breath sounds.  Musculoskeletal:        General: Normal range of motion.     Cervical back: Normal range of motion and neck supple.     Right lower leg: No edema.     Left lower leg: No edema.  Skin:    General: Skin is warm and dry.  Neurological:     Mental Status: She is alert and oriented to person, place, and time.  Psychiatric:        Mood and Affect: Mood normal.        Behavior: Behavior normal.        Thought Content: Thought content normal.        Judgment: Judgment normal.     BP 130/70 (BP Location: Left Arm, Patient Position: Sitting, Cuff Size: Large)   Pulse (!) 110   Temp 98.4 F (36.9 C) (Oral)   Resp 18   Ht 5' 10 (1.778 m)   Wt 210 lb 9.6 oz (95.5 kg)   SpO2 96%   BMI 30.22 kg/m  Wt Readings from Last 3 Encounters:  12/22/23 210 lb 9.6 oz (95.5 kg)  05/13/23 202 lb 9.6 oz  (91.9 kg)  09/27/22 206 lb 12.8 oz (93.8 kg)    Diabetic Foot Exam - Simple   No data filed    Lab Results  Component Value Date   WBC 9.4 12/22/2023   HGB 14.3 12/22/2023   HCT 42.1 12/22/2023   PLT 274.0 12/22/2023   GLUCOSE 110 (H) 12/22/2023   CHOL 270 (H) 12/22/2023   TRIG 356.0 (H) 12/22/2023   HDL 43.40 12/22/2023   LDLDIRECT 191.0 09/27/2022   LDLCALC 156 (H) 12/22/2023   ALT 18 12/22/2023   AST 15 12/22/2023   NA 140 12/22/2023   K 3.8 12/22/2023   CL 106 12/22/2023   CREATININE 0.92 12/22/2023   BUN 22 12/22/2023   CO2 24 12/22/2023   TSH 1.45 12/22/2023   HGBA1C 6.7 (H) 12/22/2023    Lab Results  Component Value Date   TSH 1.45 12/22/2023   Lab Results  Component Value Date   WBC 9.4 12/22/2023   HGB 14.3 12/22/2023   HCT 42.1 12/22/2023   MCV 79.9 12/22/2023   PLT 274.0 12/22/2023   Lab Results  Component Value Date   NA 140 12/22/2023   K 3.8 12/22/2023   CO2 24 12/22/2023   GLUCOSE 110 (H) 12/22/2023   BUN 22 12/22/2023   CREATININE 0.92 12/22/2023   BILITOT 0.5 12/22/2023   ALKPHOS 59 12/22/2023   AST 15 12/22/2023   ALT 18 12/22/2023   PROT 7.1 12/22/2023  ALBUMIN 4.6 12/22/2023   CALCIUM  9.9 12/22/2023   GFR 71.02 12/22/2023   Lab Results  Component Value Date   CHOL 270 (H) 12/22/2023   Lab Results  Component Value Date   HDL 43.40 12/22/2023   Lab Results  Component Value Date   LDLCALC 156 (H) 12/22/2023   Lab Results  Component Value Date   TRIG 356.0 (H) 12/22/2023   Lab Results  Component Value Date   CHOLHDL 6 12/22/2023   Lab Results  Component Value Date   HGBA1C 6.7 (H) 12/22/2023       Assessment & Plan:  Type 2 diabetes mellitus without complication, without long-term current use of insulin (HCC) -     CBC with Differential/Platelet -     Comprehensive metabolic panel with GFR -     Lipid panel -     TSH -     Hemoglobin A1c  Assessment and Plan Assessment & Plan Type 2 diabetes mellitus    Blood glucose levels are elevated, ranging from 200-300 mg/dL, with morning levels around 140 mg/dL. Previously managed with Ozempic , which was stopped due to nausea and rib pain, not pancreatitis. There are concerns about cardiovascular complications due to family history of diabetes and heart disease. She is not using Dexcom because of cost and insurance issues, and is considering over-the-counter continuous glucose monitor options. Labs are ordered to assess current blood glucose control and A1c. Restart Ozempic  at a lower dose after lab results. Consider over-the-counter continuous glucose monitor options.    Melvia Matousek R Lowne Chase, DO

## 2023-12-23 LAB — CBC WITH DIFFERENTIAL/PLATELET
Basophils Absolute: 0.1 K/uL (ref 0.0–0.1)
Basophils Relative: 1.2 % (ref 0.0–3.0)
Eosinophils Absolute: 0.4 K/uL (ref 0.0–0.7)
Eosinophils Relative: 3.7 % (ref 0.0–5.0)
HCT: 42.1 % (ref 36.0–46.0)
Hemoglobin: 14.3 g/dL (ref 12.0–15.0)
Lymphocytes Relative: 32.7 % (ref 12.0–46.0)
Lymphs Abs: 3.1 K/uL (ref 0.7–4.0)
MCHC: 34 g/dL (ref 30.0–36.0)
MCV: 79.9 fl (ref 78.0–100.0)
Monocytes Absolute: 0.5 K/uL (ref 0.1–1.0)
Monocytes Relative: 5.7 % (ref 3.0–12.0)
Neutro Abs: 5.3 K/uL (ref 1.4–7.7)
Neutrophils Relative %: 56.7 % (ref 43.0–77.0)
Platelets: 274 K/uL (ref 150.0–400.0)
RBC: 5.27 Mil/uL — ABNORMAL HIGH (ref 3.87–5.11)
RDW: 13.8 % (ref 11.5–15.5)
WBC: 9.4 K/uL (ref 4.0–10.5)

## 2023-12-23 LAB — COMPREHENSIVE METABOLIC PANEL WITH GFR
ALT: 18 U/L (ref 0–35)
AST: 15 U/L (ref 0–37)
Albumin: 4.6 g/dL (ref 3.5–5.2)
Alkaline Phosphatase: 59 U/L (ref 39–117)
BUN: 22 mg/dL (ref 6–23)
CO2: 24 meq/L (ref 19–32)
Calcium: 9.9 mg/dL (ref 8.4–10.5)
Chloride: 106 meq/L (ref 96–112)
Creatinine, Ser: 0.92 mg/dL (ref 0.40–1.20)
GFR: 71.02 mL/min (ref >60.00)
Glucose, Bld: 110 mg/dL — ABNORMAL HIGH (ref 70–99)
Potassium: 3.8 meq/L (ref 3.5–5.1)
Sodium: 140 meq/L (ref 135–145)
Total Bilirubin: 0.5 mg/dL (ref 0.2–1.2)
Total Protein: 7.1 g/dL (ref 6.0–8.3)

## 2023-12-23 LAB — LIPID PANEL
Cholesterol: 270 mg/dL — ABNORMAL HIGH (ref 0–200)
HDL: 43.4 mg/dL (ref >39.00)
LDL Cholesterol: 156 mg/dL — ABNORMAL HIGH (ref 0–99)
NonHDL: 226.72
Total CHOL/HDL Ratio: 6
Triglycerides: 356 mg/dL — ABNORMAL HIGH (ref 0.0–149.0)
VLDL: 71.2 mg/dL — ABNORMAL HIGH (ref 0.0–40.0)

## 2023-12-23 LAB — TSH: TSH: 1.45 u[IU]/mL (ref 0.35–5.50)

## 2023-12-23 LAB — HEMOGLOBIN A1C: Hgb A1c MFr Bld: 6.7 % — ABNORMAL HIGH (ref 4.6–6.5)

## 2023-12-25 ENCOUNTER — Other Ambulatory Visit: Payer: Self-pay | Admitting: Family Medicine

## 2023-12-25 ENCOUNTER — Ambulatory Visit: Payer: Self-pay | Admitting: Family Medicine

## 2023-12-25 DIAGNOSIS — E119 Type 2 diabetes mellitus without complications: Secondary | ICD-10-CM

## 2023-12-26 MED ORDER — ROSUVASTATIN CALCIUM 20 MG PO TABS: 20.0000 mg | ORAL_TABLET | Freq: Every day | ORAL | 0 refills | Status: AC

## 2023-12-26 NOTE — Addendum Note (Signed)
 Addended by: Imo Cumbie D on: 12/26/2023 04:07 PM   Modules accepted: Orders

## 2024-02-02 ENCOUNTER — Other Ambulatory Visit (HOSPITAL_COMMUNITY)
Admission: RE | Admit: 2024-02-02 | Discharge: 2024-02-02 | Disposition: A | Discharge status: Home / Self Care | Source: Ambulatory Visit | Attending: Obstetrics and Gynecology | Admitting: Obstetrics and Gynecology

## 2024-02-02 ENCOUNTER — Encounter: Payer: Self-pay | Admitting: Obstetrics and Gynecology

## 2024-02-02 ENCOUNTER — Ambulatory Visit: Payer: Self-pay | Admitting: Obstetrics and Gynecology

## 2024-02-02 VITALS — BP 126/81 | HR 99 | Ht 70.0 in | Wt 210.0 lb

## 2024-02-02 DIAGNOSIS — N950 Postmenopausal bleeding: Secondary | ICD-10-CM | POA: Diagnosis not present

## 2024-02-02 NOTE — Progress Notes (Signed)
 53 yo P4 postmenopausal with BMI 30 presenting for evaluation of postmenopausal vaginal bleeding. Patient reports absence of menses for 2 years. She reports intermittent vaginal bleeding for the past 2 weeks. She describes her bleeding as gushes which require wearing a pad. They occur randomly. Since yesterday, she experienced vaginal spotting throughout the day. Patient is sexually active and reports onset of vaginal bleeding is not related to intercourse. She denies pelvic pain or abnormal discharge. Patient is certain that bleeding is coming from vagina and not rectum. This is the first time she is being evaluated for this condition. Patient has had an endometrial ablation over 20 years ago and reports a monthly period lighter than previous post ablation  Past Medical History:  Diagnosis Date   Abdominal pain, RLQ 10/22/2012   Hemorrhagic ovarian cyst 10/23/2012   PONV (postoperative nausea and vomiting)    S/P endometrial ablation    S/P tubal ligation    Thyroid  disease    thyroid  nodule   UTI (urinary tract infection)    Past Surgical History:  Procedure Laterality Date   CESAREAN SECTION     x3   COLONOSCOPY     around 2008. Was having rectal bleeding. In [place]    INCISION AND DRAINAGE BREAST ABSCESS  2000   TUBAL LIGATION     Family History  Problem Relation Age of Onset   Hyperlipidemia Mother    Diabetes Mother    CAD Mother 36   Heart attack Mother 27       previous stent re-occluded   Heart disease Mother    Hyperlipidemia Father    Bladder Cancer Father        tobacco abuse   Colon polyps Father    Epilepsy Father    Seizures Father    Hypertension Sister    Other Sister        hyperglycemia   High Cholesterol Sister    Epilepsy Brother    Alcohol abuse Brother        esoph varices   Cancer Maternal Grandmother    Melanoma Maternal Grandmother 26   Colon cancer Maternal Grandmother    Other Maternal Grandfather        no family history known   Cancer  Paternal Grandmother        colon?   Alcohol abuse Paternal Grandfather    Other Paternal Grandfather        car accident   Esophageal cancer Neg Hx    Social History   Socioeconomic History   Marital status: Married    Spouse name: Not on file   Number of children: 4   Years of education: Not on file   Highest education level: Not on file  Occupational History   Occupation: Nurse    Comment: Hospice nurse   Tobacco Use   Smoking status: Never   Smokeless tobacco: Never  Vaping Use   Vaping status: Never Used  Substance and Sexual Activity   Alcohol use: Yes    Comment: occasionally   Drug use: No   Sexual activity: Yes    Birth control/protection: None  Other Topics Concern   Not on file  Social History Narrative   Not on file   Social Drivers of Health   Tobacco Use: Low Risk (02/02/2024)   Patient History    Smoking Tobacco Use: Never    Smokeless Tobacco Use: Never    Passive Exposure: Not on file  Financial Resource Strain: Not on file  Food  Insecurity: Not on file  Transportation Needs: Not on file  Physical Activity: Not on file  Stress: Not on file  Social Connections: Unknown (06/19/2021)   Received from [facility]   Social Network    Social Network: Not on file  Depression (PHQ2-9): Low Risk (12/22/2023)   Depression (PHQ2-9)    PHQ-2 Score: 0  Alcohol Screen: Not on file  Housing: Not on file  Utilities: Not on file  Health Literacy: Not on file   ROS See pertinent in HPI. All other systems reviewed and non contributory Blood pressure 126/81, pulse 99, height 5' 10 (1.778 m), weight 210 lb (95.3 kg), last menstrual period 04/04/2020. GENERAL: Well-developed, well-nourished female in no acute distress.  ABDOMEN: Soft, nontender, nondistended. No organomegaly. PELVIC: Normal external female genitalia. Vagina is pink and rugated.  Normal discharge. Normal appearing cervix. Uterus is normal in size. No adnexal mass or tenderness. Chaperone  present during the pelvic exam EXTREMITIES: No cyanosis, clubbing, or edema, 2+ distal pulses.  A/P 53 yo with postmenopausal vaginal bleeding - Pap smear updated - pelvic ultrasound ordered - patient declined endometrial biopsy today and understand its importance to rule out malignancy. Patient plans to return for endometrial biopsy in the next few weeks - Patient will be contacted with the results of the ultrasound and pap smear

## 2024-02-02 NOTE — Progress Notes (Signed)
 53 y.o. GYN presents for ENDO Bx.  UPT Negative

## 2024-02-04 ENCOUNTER — Ambulatory Visit (HOSPITAL_BASED_OUTPATIENT_CLINIC_OR_DEPARTMENT_OTHER)
Admission: RE | Admit: 2024-02-04 | Discharge: 2024-02-04 | Disposition: A | Discharge status: Home / Self Care | Source: Ambulatory Visit | Attending: Obstetrics and Gynecology | Admitting: Obstetrics and Gynecology

## 2024-02-04 DIAGNOSIS — N950 Postmenopausal bleeding: Secondary | ICD-10-CM | POA: Diagnosis present

## 2024-02-06 LAB — CYTOLOGY - PAP
Comment: NEGATIVE
Diagnosis: NEGATIVE
High risk HPV: NEGATIVE

## 2024-02-11 ENCOUNTER — Encounter: Payer: Self-pay | Admitting: Obstetrics and Gynecology

## 2024-02-16 ENCOUNTER — Ambulatory Visit: Payer: Self-pay | Admitting: Obstetrics and Gynecology

## 2024-02-23 ENCOUNTER — Emergency Department (HOSPITAL_COMMUNITY)
Admission: EM | Admit: 2024-02-23 | Discharge: 2024-02-23 | Disposition: A | Discharge status: Home / Self Care | Attending: Emergency Medicine | Admitting: Emergency Medicine

## 2024-02-23 ENCOUNTER — Encounter (HOSPITAL_COMMUNITY): Payer: Self-pay

## 2024-02-23 ENCOUNTER — Other Ambulatory Visit: Payer: Self-pay

## 2024-02-23 ENCOUNTER — Emergency Department (HOSPITAL_COMMUNITY)

## 2024-02-23 DIAGNOSIS — R072 Precordial pain: Secondary | ICD-10-CM | POA: Diagnosis not present

## 2024-02-23 DIAGNOSIS — R0789 Other chest pain: Secondary | ICD-10-CM | POA: Diagnosis present

## 2024-02-23 LAB — CBC
HCT: 46.3 % — ABNORMAL HIGH (ref 36.0–46.0)
Hemoglobin: 15.3 g/dL — ABNORMAL HIGH (ref 12.0–15.0)
MCH: 27 pg (ref 26.0–34.0)
MCHC: 33 g/dL (ref 30.0–36.0)
MCV: 81.8 fL (ref 80.0–100.0)
Platelets: 290 K/uL (ref 150–400)
RBC: 5.66 MIL/uL — ABNORMAL HIGH (ref 3.87–5.11)
RDW: 12.9 % (ref 11.5–15.5)
WBC: 10.5 K/uL (ref 4.0–10.5)
nRBC: 0 % (ref 0.0–0.2)

## 2024-02-23 LAB — HEPATIC FUNCTION PANEL
ALT: 25 U/L (ref 0–44)
AST: 22 U/L (ref 15–41)
Albumin: 4.7 g/dL (ref 3.5–5.0)
Alkaline Phosphatase: 69 U/L (ref 38–126)
Bilirubin, Direct: 0.2 mg/dL (ref 0.0–0.2)
Indirect Bilirubin: 0.3 mg/dL (ref 0.3–0.9)
Total Bilirubin: 0.5 mg/dL (ref 0.0–1.2)
Total Protein: 7.8 g/dL (ref 6.5–8.1)

## 2024-02-23 LAB — BASIC METABOLIC PANEL WITH GFR
Anion gap: 12 (ref 5–15)
BUN: 13 mg/dL (ref 6–20)
CO2: 23 mmol/L (ref 22–32)
Calcium: 10.3 mg/dL (ref 8.9–10.3)
Chloride: 105 mmol/L (ref 98–111)
Creatinine, Ser: 0.95 mg/dL (ref 0.44–1.00)
GFR, Estimated: >60 mL/min
Glucose, Bld: 117 mg/dL — ABNORMAL HIGH (ref 70–99)
Potassium: 4.3 mmol/L (ref 3.5–5.1)
Sodium: 139 mmol/L (ref 135–145)

## 2024-02-23 LAB — LIPASE, BLOOD: Lipase: 55 U/L — ABNORMAL HIGH (ref 11–51)

## 2024-02-23 LAB — TROPONIN T, HIGH SENSITIVITY
Troponin T High Sensitivity: <15 ng/L (ref 0–19)
Troponin T High Sensitivity: <15 ng/L (ref 0–19)

## 2024-02-23 MED ORDER — ALUM & MAG HYDROXIDE-SIMETH 200-200-20 MG/5ML PO SUSP
30.0000 mL | Freq: Once | ORAL | Status: AC
  Administered 2024-02-23: 30 mL via ORAL
  Filled 2024-02-23: qty 30

## 2024-02-23 NOTE — ED Triage Notes (Signed)
 Pt came in via POV d/t CP that started the past 2 hrs before arrival. CP felt on the Lt side that described as twinges & would feel pan radiate into her back. Felt that she began feeling a tingling I her jaw. A/Ox4, rates her pain 4/10 during triage, does report a family Hx of cardiac problems. Does feel nauseous, denies being diaphoretic.

## 2024-02-23 NOTE — ED Provider Notes (Signed)
 "  Emergency Department Provider Note   I have reviewed the triage vital signs and the nursing notes.   HISTORY  Chief Complaint Chest Pain   HPI Sydney Hale is a 54 y.o. female with past history reviewed presents to the emergency department with left-sided chest discomfort radiating to the back.  No pleuritic pain or shortness of breath.  No diaphoresis or nausea.  She was walking to her car when symptoms began and seem to become fairly uncomfortable for a time and then decrease in severity.  She has had some associated belching but no vomiting.  No diaphoresis.  No personal history of known ACS but strong family history on her mother's side. Had CT coronary in 2023 for that reason which was reassuring.    Past Medical History:  Diagnosis Date   Abdominal pain, RLQ 10/22/2012   Hemorrhagic ovarian cyst 10/23/2012   PONV (postoperative nausea and vomiting)    S/P endometrial ablation    S/P tubal ligation    Thyroid  disease    thyroid  nodule   UTI (urinary tract infection)     Review of Systems  Constitutional: No fever/chills Cardiovascular: Positive chest pain. Respiratory: Denies shortness of breath. Gastrointestinal: No abdominal pain.  No nausea, no vomiting.  Neurological: Negative for headaches.  ____________________________________________   PHYSICAL EXAM:  VITAL SIGNS: ED Triage Vitals  Encounter Vitals Group     BP 02/23/24 1157 (!) 182/98     Pulse Rate 02/23/24 1157 (!) 105     Resp 02/23/24 1157 18     Temp 02/23/24 1200 (!) 97.4 F (36.3 C)     Temp Source 02/23/24 1200 Oral     SpO2 02/23/24 1157 98 %     Weight 02/23/24 1200 208 lb (94.3 kg)     Height 02/23/24 1200 5' 10 (1.778 m)   Constitutional: Alert and oriented. Well appearing and in no acute distress. Eyes: Conjunctivae are normal.  Head: Atraumatic. Nose: No congestion/rhinnorhea. Mouth/Throat: Mucous membranes are moist.   Neck: No stridor.   Cardiovascular: Normal rate,  regular rhythm. Good peripheral circulation. Grossly normal heart sounds.   Respiratory: Normal respiratory effort.  No retractions. Lungs CTAB. Gastrointestinal: Soft and nontender. No distention.  Musculoskeletal: No lower extremity tenderness nor edema. No gross deformities of extremities. Neurologic:  Normal speech and language. No gross focal neurologic deficits are appreciated.  Skin:  Skin is warm, dry and intact. No rash noted.  ____________________________________________   LABS (all labs ordered are listed, but only abnormal results are displayed)  Labs Reviewed  BASIC METABOLIC PANEL WITH GFR - Abnormal; Notable for the following components:      Result Value   Glucose, Bld 117 (*)    All other components within normal limits  CBC - Abnormal; Notable for the following components:   RBC 5.66 (*)    Hemoglobin 15.3 (*)    HCT 46.3 (*)    All other components within normal limits  HEPATIC FUNCTION PANEL  LIPASE, BLOOD  TROPONIN T, HIGH SENSITIVITY  TROPONIN T, HIGH SENSITIVITY   ____________________________________________  EKG   EKG Interpretation Date/Time:  Monday February 23 2024 11:56:05 EST Ventricular Rate:  101 PR Interval:  120 QRS Duration:  74 QT Interval:  316 QTC Calculation: 409 R Axis:   -25  Text Interpretation: Sinus tachycardia Inferior infarct , age undetermined Cannot rule out Anterior infarct , age undetermined Abnormal ECG When compared with ECG of 13-Oct-1998 14:06, PREVIOUS ECG IS PRESENT Confirmed by Levander,  Danielle [phone]) on 02/23/2024 12:35:57 PM        ____________________________________________  RADIOLOGY  DG Chest 2 View Result Date: 02/23/2024 CLINICAL DATA:  Chest pain. EXAM: CHEST - 2 VIEW COMPARISON:  06/20/2023. FINDINGS: Trachea is midline. Heart size normal. Lungs are clear. No pleural fluid. Mild compression of midthoracic vertebral bodies with kyphosis, old. IMPRESSION: No acute findings. Electronically Signed   By:  Newell Eke M.D.   On: 02/23/2024 12:52    ____________________________________________   PROCEDURES  Procedure(s) performed:   Procedures  None ____________________________________________   INITIAL IMPRESSION / ASSESSMENT AND PLAN / ED COURSE  Pertinent labs & imaging results that were available during my care of the patient were reviewed by me and considered in my medical decision making (see chart for details).   This patient is Presenting for Evaluation of CP, which does require a range of treatment options, and is a complaint that involves a high risk of morbidity and mortality.  The Differential Diagnoses includes but is not exclusive to acute coronary syndrome, aortic dissection, pulmonary embolism, cardiac tamponade, community-acquired pneumonia, pericarditis, musculoskeletal chest wall pain, etc.   Critical Interventions-    Medications  alum & mag hydroxide-simeth (MAALOX/MYLANTA) 200-200-20 MG/5ML suspension 30 mL (has no administration in time range)    Reassessment after intervention: symptoms improved.    Clinical Laboratory Tests Ordered, included troponin negative. No AKI. CBC without anemia.   Radiologic Tests Ordered, included CXR. I independently interpreted the images and agree with radiology interpretation.   Cardiac Monitor Tracing which shows NSR.    Social Determinants of Health Risk patient is a non-smoker.   Medical Decision Making: Summary:  Patient presents to the emergency department for evaluation of chest pain.  Symptoms improved.  Considered PE with initial tachycardia but no pleuritic pain, shortness of breath, hypoxemia.  No significant PE risk factors.  Have added on LFTs and will obtain a second troponin. Cardiology referral planned.   Reevaluation with update and discussion with patient. Second troponin negative. Plan for Cardiology referral. Discussed strict ED return precautions should symptoms worsen.   Considered admission  but CP workup is reassuring. Stable for outpatient follow up.   Patient's presentation is most consistent with acute presentation with potential threat to life or bodily function.   Disposition: discharge  ____________________________________________  FINAL CLINICAL IMPRESSION(S) / ED DIAGNOSES  Final diagnoses:  Precordial chest pain    Note:  This document was prepared using Dragon voice recognition software and may include unintentional dictation errors.  Fonda Law, MD, [facility]    Nykeem Citro, Fonda MATSU, MD 02/24/24 [phone]  "

## 2024-02-23 NOTE — Discharge Instructions (Signed)

## 2024-03-04 ENCOUNTER — Ambulatory Visit: Payer: Self-pay | Admitting: Obstetrics and Gynecology

## 2024-04-16 ENCOUNTER — Ambulatory Visit: Admitting: Obstetrics and Gynecology

## 2024-04-19 ENCOUNTER — Ambulatory Visit: Admitting: Obstetrics and Gynecology

## 2024-06-21 ENCOUNTER — Encounter: Admitting: Family Medicine
